# Patient Record
Sex: Male | Born: 1940 | Race: White | Hispanic: No | Marital: Married | Smoking: Former smoker
Health system: Southern US, Community
[De-identification: ages and names within clinical notes are randomized; demographics above are authoritative.]

## PROBLEM LIST (undated history)

## (undated) DIAGNOSIS — I480 Paroxysmal atrial fibrillation: Secondary | ICD-10-CM

## (undated) DIAGNOSIS — M1990 Unspecified osteoarthritis, unspecified site: Secondary | ICD-10-CM

## (undated) DIAGNOSIS — J449 Chronic obstructive pulmonary disease, unspecified: Secondary | ICD-10-CM

## (undated) DIAGNOSIS — C61 Malignant neoplasm of prostate: Secondary | ICD-10-CM

## (undated) DIAGNOSIS — I10 Essential (primary) hypertension: Secondary | ICD-10-CM

## (undated) DIAGNOSIS — G4730 Sleep apnea, unspecified: Secondary | ICD-10-CM

## (undated) HISTORY — PX: POLYPECTOMY: SHX149

## (undated) HISTORY — PX: FRACTURE SURGERY: SHX138

## (undated) HISTORY — DX: Essential (primary) hypertension: I10

## (undated) HISTORY — DX: Malignant neoplasm of prostate: C61

## (undated) HISTORY — PX: COLONOSCOPY: SHX174

## (undated) HISTORY — DX: Unspecified osteoarthritis, unspecified site: M19.90

## (undated) HISTORY — DX: Paroxysmal atrial fibrillation: I48.0

## (undated) HISTORY — DX: Chronic obstructive pulmonary disease, unspecified: J44.9

## (undated) HISTORY — DX: Sleep apnea, unspecified: G47.30

## (undated) HISTORY — PX: PROSTATECTOMY: SHX69

## (undated) HISTORY — PX: HERNIA REPAIR: SHX51

---

## 2007-04-24 ENCOUNTER — Ambulatory Visit (HOSPITAL_COMMUNITY): Admission: RE | Admit: 2007-04-24 | Discharge: 2007-04-24 | Payer: Self-pay | Admitting: Urology

## 2007-06-21 ENCOUNTER — Encounter: Payer: Self-pay | Admitting: Urology

## 2007-06-21 ENCOUNTER — Inpatient Hospital Stay (HOSPITAL_COMMUNITY): Admission: RE | Admit: 2007-06-21 | Discharge: 2007-06-22 | Payer: Self-pay | Admitting: Urology

## 2007-11-14 ENCOUNTER — Ambulatory Visit: Payer: Self-pay | Admitting: Gastroenterology

## 2007-11-14 LAB — CONVERTED CEMR LAB
Albumin: 3.7 g/dL (ref 3.5–5.2)
Alkaline Phosphatase: 121 units/L — ABNORMAL HIGH (ref 39–117)
BUN: 14 mg/dL (ref 6–23)
Basophils Absolute: 0.1 10*3/uL (ref 0.0–0.1)
Eosinophils Absolute: 0.2 10*3/uL (ref 0.0–0.6)
GFR calc Af Amer: 124 mL/min
Hemoglobin: 14.5 g/dL (ref 13.0–17.0)
Lymphocytes Relative: 38.5 % (ref 12.0–46.0)
MCHC: 35.4 g/dL (ref 30.0–36.0)
Monocytes Absolute: 0.9 10*3/uL — ABNORMAL HIGH (ref 0.2–0.7)
Monocytes Relative: 11.2 % — ABNORMAL HIGH (ref 3.0–11.0)
Neutro Abs: 3.5 10*3/uL (ref 1.4–7.7)
Potassium: 4.1 meq/L (ref 3.5–5.1)
Total Protein: 6.9 g/dL (ref 6.0–8.3)

## 2007-11-26 ENCOUNTER — Encounter: Payer: Self-pay | Admitting: Gastroenterology

## 2007-11-26 ENCOUNTER — Ambulatory Visit: Payer: Self-pay | Admitting: Gastroenterology

## 2008-12-08 ENCOUNTER — Ambulatory Visit: Payer: Self-pay | Admitting: Gastroenterology

## 2008-12-24 ENCOUNTER — Encounter: Payer: Self-pay | Admitting: Gastroenterology

## 2008-12-24 ENCOUNTER — Ambulatory Visit: Payer: Self-pay | Admitting: Gastroenterology

## 2008-12-29 ENCOUNTER — Encounter: Payer: Self-pay | Admitting: Gastroenterology

## 2011-03-29 NOTE — Assessment & Plan Note (Signed)
HEALTHCARE                         GASTROENTEROLOGY OFFICE NOTE   NAME:Robert Gonzalez, Robert Gonzalez                   MRN:          [id number]  DATE:11/14/2007                            DOB:          November 08, 1941    REFERRING PHYSICIAN:  Excell Seltzer. Annabell Howells, M.D.   NEW GI CONSULTATION:   PRIMARY CARE PHYSICIAN:  Wyvonnia Lora, MD   REASON FOR REFERRAL:  Dr. Annabell Howells asked me to evaluate Robert Gonzalez in  consultation regarding relative constipation, intermittent bright red  blood per rectum, mucous discharge from the rectum.   HISTORY OF PRESENT ILLNESS:  Robert Gonzalez is a very pleasant 70-year-  old man who had a radical prostatectomy by robot-assisted means in  August of this year for a relatively early-stage prostate cancer.  He  did not have any radiation treatments.  For the past several months he  has been bothered by intermittent constipation and he has to strain to  move his bowels.  He will have a firm, hard stool.  This seems to be  getting worse lately.  Approximately once a week he will have to strain  to move his bowels.  He also feels a feeling of incomplete evacuation.  About a week ago he noted a small amount of rectal blood and mucus in  his stool.  The rectal bleeding had been twice in one day.  Once was  with tissue paper wiping only.   REVIEW OF SYSTEMS:  Notable for no chest pain, no shortness of breath,  no fatigue.  Otherwise essentially normal and is available on his  nursing intake sheet.   PAST MEDICAL HISTORY:  1. Radical prostatectomy in August 2008.  2. Hernia surgery, left and right, 2000 and 2007.   CURRENT MEDICINES:  Trazodone.   ALLERGIES:  No known drug allergies.   SOCIAL HISTORY:  Married with one son.  Lives with his wife.  Nonsmoker,  nondrinker.   FAMILY HISTORY:  No colon cancer or colon polyps in the family.   PHYSICAL EXAM:  6 feet 0 inches, 158 pounds.  Blood pressure 114/74,  pulse 68.  CONSTITUTIONAL:   Generally well-appearing.  NEUROLOGIC:  Alert and oriented x3.  EYES:  Extraocular movements intact.  MOUTH:  Oropharynx moist, no lesions.  NECK:  Supple with no  lymphadenopathy.  CARDIOVASCULAR:  Heart regular rate and rhythm.  LUNGS:  Clear to auscultation bilaterally.  ABDOMEN:  Soft, nontender, nondistended, normal bowel sounds.  EXTREMITIES:  No lower extremity edema.  SKIN:  No rash or lesions on his extremities.   ASSESSMENT AND PLAN:  A 70 year old man with mild constipation,  intermittent bright red blood per rectum and mucous discharge.   I did not mention above, but he has had some lower abdominal cramping  that does seem to be relieved when he moves his bowels.  I suspect that  the etiology of his bleeding is not cancerous; however, we should  proceed with full colonoscopy at his soonest convenience.  I have  discussed with him fiber supplementation.  He will wean himself up to  one full scoop of Citrucel on a daily basis  and that will probably go a  long way to helping his intermittent constipation and as well his mild  intermittent rectal bleeding.  Get a basic set of labs including a CBC,  a complete metabolic profile and thyroid testing as well.  I see no  reason for any further blood tests or imaging studies other than those  mentioned above.     Rachael Fee, MD  Electronically Signed    DPJ/MedQ  DD: 11/14/2007  DT: 11/14/2007  Job #: [id number]   cc:   Excell Seltzer. Annabell Howells, M.D.  Wyvonnia Lora

## 2011-03-29 NOTE — Op Note (Signed)
NAME:  Robert Gonzalez, Robert Gonzalez NO.:  [id number]   MEDICAL RECORD NO.:  [id number]          PATIENT TYPE:  INP   LOCATION:  1442                         FACILITY:  [facility]   PHYSICIAN:  Excell Seltzer. Annabell Howells, M.D.    DATE OF BIRTH:  [birth year]   DATE OF PROCEDURE:  06/21/2007  DATE OF DISCHARGE:                               OPERATIVE REPORT   PROCEDURE:  Robot assisted laparoscopic radical prostatectomy with  bilateral pelvic lymphadenectomy.   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   POSTOPERATIVE DIAGNOSIS:  T1c Gleason's 8 adenocarcinoma of the  prostate.   SURGEON:  Dr. Bjorn Pippin.   ANESTHESIA:  General.   ASSISTANT:  Crecencio Mc, M.D.   ANESTHESIA:  General.   SPECIMEN:  Prostate seminal vesicles and bilateral pelvic lymph nodes.   DRAINS:  A 20-French Foley catheter and a Blake drain.   BLOOD LOSS:  200 mL   COMPLICATIONS:  None.   INDICATIONS:  [name] is a 70 year old white male who was  originally seen in consultation for a rising PSA of 5.  He did have a  soft nodule in the mid portion of the right prostate but a biopsy  demonstrated Gleason's 8 adenocarcinoma of the prostate involving 20% of  the left apical biopsies.  After discussing treatment options, he  elected radical prostatectomy.  His preoperative bone scan was  unremarkable.   FINDINGS/PROCEDURE:  The patient was given Ancef and taken to the  operating room where general anesthetic was induced.  He was placed in  the lithotomy position.  His abdomen was clipped.  He was prepped with  Hibiclens because of a prior reaction following a surgical prep.  He was  then placed in the steep Trendelenburg position and was draped in the  usual sterile fashion for the robotic procedure.  A 22-French Foley  catheter was inserted.  The balloon was filled with 30 mL of sterile  fluid.  He then underwent placement of the camera port 18 cm superior to  the pubis.  An  incision was made, the fat was spread with a hemostat.  The anterior rectus fascia was incised with the Bovie.  The rectus  muscle was spread and the posterior sheath was incised with a knife.  Hemostat was then passed through the incision and spread, opening the  posterior sheath.  Finger was then placed in the abdominal cavity and  swept around to confirm position.  A 12 mm port was then placed through  the incision.  This was secured with a figure-of-eight 0 Vicryl to  tighten the skin and the abdomen was then insufflated.  The remaining  ports were placed in the routine configuration with two 8 mm a robot  ports on the left. On the right there was an 8 mm robot port, a lateral  12 mm working port and a superior medial 5 mm working port.  The port  sites were injected with local anesthetic prior to placement.  Once all  of ports were in position, the robot was brought onto the field and  docked  The bladder was then filled with irrigant and the dissection was  initiated.  The bladder was taken down from the anterior abdominal wall  to expose the prostate.  On the right, the patient had a preperitoneal  hernia repair with mesh.  This made for a little bit slower dictation  but we easily exposed the proper space.  Once the prostate had been  exposed, the endopelvic fascia was incised on each side, freeing up the  lateral aspect of the prostate.  The dissection was carried down to the  dorsal vein complex which was dissected out by division of the  puboprostatic ligaments and exposure down to the groove between the  dorsal vein and the urethra on each side.  At this point, the dorsal  vein complex was controlled with an Endo-GIA stapler.   We then turned our attention to the bladder neck dissection.  The  bladder neck was incised anteriorly using the cautery scissors until the  Foley catheter was exposed.  The Foley catheter was then grasped with  the fourth arm and anterior traction  was applied.  The posterior of  bladder neck was then incised with great care to avoid excessive  enlargement of the bladder neck opening and the structures were exposed.  The left vas was dissected out, divided and used to provide anterior  traction with the fourth arm.  The left seminal vesicle was then  dissected out using cautery and blunt dissection.  The right vas and  seminal vesicle were then dissected out in a similar fashion without  difficulty.  At this point, Denonvilliers' fascia was opened and the  prostate was dissected off the rectum, out to the apex and out laterally  as well.   At this point, the anterior traction was released, the prostate was  retracted to the left with the fourth arm and a left nerve spare was  performed.  The prostatic fascia was incised and the plane was developed  between the prostate and the neurovascular bundle which was dissected  off without difficulty.  This was then repeated on the left side.  Once  both nerve-sparing dissections were performed, we turned our attention  to the pedicles.  The right pedicle was taken down using large clips and  the remaining attachments of prostate were dissected out to the apex.  This was then repeated on the left side.  A small area of bleeding on  the right pedicle required bipolar cautery but in general, hemostasis  was good.   At this point, the Foley catheter was reinserted.  The residual dorsal  vein complex was divided using the cautery scissors and the urethra was  divided using sharp dissection without cautery.  The specimen was then  removed from the field and the pelvic floor was inspected.  No active  bleeding was identified.  The pelvis was irrigated and rectum was  insufflated without evidence of rectal injury.  The pelvis was then  aspirated.   We then turned our attention to the node dissection.  The left node  dissection was performed initially.  The iliac vein was identified, the  node  packet was developed out to the body wall and the obturator nerve  was then identified and the node packet was freed from the obturator  nerve.  The proximal end of the packet was clipped and divided.  I then  performed additional dissection to expose the packet down to the  bifurcation of the iliac vessels where another  clip was placed.  The  packet was then removed.  This was then repeated on the right side  without difficulty.  Once both node packets had been removed, we turned  our attention to the anastomosis.   A 3-0 Vicryl suture was used to reapproximate the rectourethralis and  the incised Denonvilliers' fascia to remove traction from the  anastomosis.  A second 3-0 Vicryl suture was used to provide a stay  suture between the posterior urethra and the bladder neck.  Once this  suture had been tied, a two-tone Monocryl stitch was then used to  perform a running anastomosis between the urethra and bladder neck.  Once the anastomosis had been completed, the sutures were tightened and  tied.  A fresh 20-French coude Foley catheter was inserted.  The balloon  was filled with 15 mL of sterile fluid and the catheter was irrigated  easily without evidence of anastomotic leak.   At this point, we did notice a little bleeding from the right side.  Inspection revealed that it was coming from some vessels near the  sidewall at the apex that required some bipolar cautery for control  along with a small piece of Surgicel.   At this point, inspection revealed no active bleeding.  The #10 round  Blake drain was placed through the fourth arm port and positioned in the  pelvis.  The port was then removed and the drain was secured with a 3-0  nylon suture.  The robot was undocked and moved away from the field.  The specimen was placed in an entrapment sac through the camera port.  The camera port was then reinserted and the right 12 mm working port was  removed and the port site was closed with  2-0 Vicryl and a suture  passer.  Once this had been tied, the remaining ports were removed.   We then opened the camera port incision and extended it to remove the  specimen.  During this portion of the procedure, we noted some bright  red bleeding from the camera port.  Initially, there was concern this  was intra-abdominal bleeding so the port was replaced and the abdomen  was reinspected, no active bleeding was noted.  We then discovered a  small arterial perforator in the muscle that required cauterization.  Once this had been performed, no further bleeding was noted.  The  specimen was then removed and the anterior rectus fascia of the camera  port was closed with a running 0 Vicryl suture.  The port sites were  then reinfiltrated with local anesthetic and closed with skin staples.  The catheter was irrigated once again with return of blue stained urine  from the indigo carmine given during the procedure.  The catheter was  placed to straight drainage, the drapes were removed.  Dressings were  applied to the abdominal incision.  The catheter was secured to the  patient's leg.  He was taken down from lithotomy position.  His  anesthetic was reversed.  He was moved to the recovery room in stable  condition.  There were no complications.   Early in the procedure during the set-up, a red rubber rectal catheter  was placed prior to the prep and drape.      Excell Seltzer. Annabell Howells, M.D.  Electronically Signed     JJW/MEDQ  D:  06/22/2007  T:  06/22/2007  Job:  [id number]   cc:   Wyvonnia Lora  Fax: [phone]

## 2011-08-29 LAB — BASIC METABOLIC PANEL
CO2: 29
CO2: 30
Calcium: 8.1 — ABNORMAL LOW
Chloride: 106
Chloride: 102
GFR calc Af Amer: >60
GFR calc Af Amer: >60
GFR calc non Af Amer: >60
Potassium: 4.5
Sodium: 139
Sodium: 141

## 2011-08-29 LAB — CBC
Hemoglobin: 12 — ABNORMAL LOW
MCHC: 34.2
Platelets: 276
RBC: 3.75 — ABNORMAL LOW
RDW: 12
WBC: 8.2

## 2011-08-29 LAB — DIFFERENTIAL
Basophils Absolute: 0.1
Basophils Relative: 1
Eosinophils Relative: 3
Lymphocytes Relative: 33
Lymphocytes Relative: 38
Lymphs Abs: 3.9 — ABNORMAL HIGH
Monocytes Absolute: 1.2 — ABNORMAL HIGH
Monocytes Relative: 10
Neutro Abs: 6.1
Neutro Abs: 4.1
Neutrophils Relative %: 50

## 2011-11-30 ENCOUNTER — Encounter: Payer: Self-pay | Admitting: Gastroenterology

## 2011-12-02 ENCOUNTER — Encounter: Payer: Self-pay | Admitting: Gastroenterology

## 2012-01-09 ENCOUNTER — Ambulatory Visit (AMBULATORY_SURGERY_CENTER): Payer: BC Managed Care – PPO | Admitting: *Deleted

## 2012-01-09 DIAGNOSIS — Z1211 Encounter for screening for malignant neoplasm of colon: Secondary | ICD-10-CM

## 2012-01-09 DIAGNOSIS — Z86010 Personal history of colonic polyps: Secondary | ICD-10-CM

## 2012-01-09 MED ORDER — PEG-KCL-NACL-NASULF-NA ASC-C 100 G PO SOLR: ORAL | Status: DC

## 2012-01-23 ENCOUNTER — Ambulatory Visit (AMBULATORY_SURGERY_CENTER): Payer: BC Managed Care – PPO | Admitting: Gastroenterology

## 2012-01-23 ENCOUNTER — Encounter: Payer: Self-pay | Admitting: Gastroenterology

## 2012-01-23 ENCOUNTER — Other Ambulatory Visit: Payer: Self-pay | Admitting: Gastroenterology

## 2012-01-23 VITALS — BP 144/94 | HR 79 | Temp 96.4°F | Resp 15 | Ht 72.0 in | Wt 163.0 lb

## 2012-01-23 DIAGNOSIS — D126 Benign neoplasm of colon, unspecified: Secondary | ICD-10-CM

## 2012-01-23 DIAGNOSIS — Z1211 Encounter for screening for malignant neoplasm of colon: Secondary | ICD-10-CM

## 2012-01-23 DIAGNOSIS — Z86010 Personal history of colonic polyps: Secondary | ICD-10-CM

## 2012-01-23 MED ORDER — SODIUM CHLORIDE 0.9 % IV SOLN: 500.0000 mL | INTRAVENOUS | Status: DC

## 2012-01-23 NOTE — Patient Instructions (Signed)
Discharge instructions given with verbal understanding. Handout on polyps given. Resume previous medications. YOU HAD AN ENDOSCOPIC PROCEDURE TODAY AT THE [facility]: Refer to the procedure report that was given to you for any specific questions about what was found during the examination.  If the procedure report does not answer your questions, please call your gastroenterologist to clarify.  If you requested that your care partner not be given the details of your procedure findings, then the procedure report has been included in a sealed envelope for you to review at your convenience later.  YOU SHOULD EXPECT: Some feelings of bloating in the abdomen. Passage of more gas than usual.  Walking can help get rid of the air that was put into your GI tract during the procedure and reduce the bloating. If you had a lower endoscopy (such as a colonoscopy or flexible sigmoidoscopy) you may notice spotting of blood in your stool or on the toilet paper. If you underwent a bowel prep for your procedure, then you may not have a normal bowel movement for a few days.  DIET: Your first meal following the procedure should be a light meal and then it is ok to progress to your normal diet.  A half-sandwich or bowl of soup is an example of a good first meal.  Heavy or fried foods are harder to digest and may make you feel nauseous or bloated.  Likewise meals heavy in dairy and vegetables can cause extra gas to form and this can also increase the bloating.  Drink plenty of fluids but you should avoid alcoholic beverages for 24 hours.  ACTIVITY: Your care partner should take you home directly after the procedure.  You should plan to take it easy, moving slowly for the rest of the day.  You can resume normal activity the day after the procedure however you should NOT DRIVE or use heavy machinery for 24 hours (because of the sedation medicines used during the test).    SYMPTOMS TO REPORT IMMEDIATELY: A  gastroenterologist can be reached at any hour.  During normal business hours, 8:30 AM to 5:00 PM Monday through Friday, call [phone].  After hours and on weekends, please call the GI answering service at [phone] who will take a message and have the physician on call contact you.   Following lower endoscopy (colonoscopy or flexible sigmoidoscopy):  Excessive amounts of blood in the stool  Significant tenderness or worsening of abdominal pains  Swelling of the abdomen that is new, acute  Fever of 100F or higher  FOLLOW UP: If any biopsies were taken you will be contacted by phone or by letter within the next 1-3 weeks.  Call your gastroenterologist if you have not heard about the biopsies in 3 weeks.  Our staff will call the home number listed on your records the next business day following your procedure to check on you and address any questions or concerns that you may have at that time regarding the information given to you following your procedure. This is a courtesy call and so if there is no answer at the home number and we have not heard from you through the emergency physician on call, we will assume that you have returned to your regular daily activities without incident.  SIGNATURES/CONFIDENTIALITY: You and/or your care partner have signed paperwork which will be entered into your electronic medical record.  These signatures attest to the fact that that the information above on your After Visit Summary has   been reviewed and is understood.  Full responsibility of the confidentiality of this discharge information lies with you and/or your care-partner. 

## 2012-01-23 NOTE — Op Note (Signed)
[facility] [address] [place], [place]  [address]  COLONOSCOPY PROCEDURE REPORT  PATIENT:  Robert Gonzalez, Robert Gonzalez  MR#:  [id number] BIRTHDATE:  08/23/1941, 71 yrs. old  GENDER:  male ENDOSCOPIST:  Rachael Fee, MD PROCEDURE DATE:  01/23/2012 PROCEDURE:  Colonoscopy with snare polypectomy ASA CLASS:  Class II INDICATIONS:  adenomatous polyps 2009 and 2010 colonoscopies MEDICATIONS:  Fentanyl 100 mcg IV, These medications were titrated to patient response per physician's verbal order, Versed 8 mg IV  DESCRIPTION OF PROCEDURE:   After the risks benefits and alternatives of the procedure were thoroughly explained, informed consent was obtained.  Digital rectal exam was performed and revealed no rectal masses.   The  endoscope was introduced through the anus and advanced to the cecum, which was identified by both the appendix and ileocecal valve, without limitations.  The quality of the prep was good..  The instrument was then slowly withdrawn as the colon was fully examined. <<PROCEDUREIMAGES>> FINDINGS:   Five polyps were found, removed and sent to pathology. One was pedunculated, located in sigmoid, measured 1cm across, removed with snare/cautery and sent to pathology (jar 2). The rest were sessile, ranging in size from 3 to 8mm, located in ascending and transverse segments, removed with cold snare and all sent to pathology (jar 1) (see image1, image4, and image5).  This was otherwise a normal examination of the colon (see image3, image2, and image8).   Retroflexed views in the rectum revealed no abnormalities. COMPLICATIONS:  None  ENDOSCOPIC IMPRESSION: 1) Five polyps, all were removed and all were sent to pathology  2) Otherwise normal examination  RECOMMENDATIONS: 1) If the polyp(s) removed today are proven to be adenomatous (pre-cancerous) polyps, you will need a colonoscopy in 3 years. You will receive a letter within 1-2 weeks with the results of  your biopsy as well as final recommendations. Please call my office if you have not received a letter after 3 weeks.  ______________________________ Rachael Fee, MD  cc: Wyvonnia Lora, MD  n. eSIGNED:   Rachael Fee at 01/23/2012 10:53 AM  Karlyn Agee, [phone]

## 2012-01-23 NOTE — Progress Notes (Signed)
Patient did not experience any of the following events: a burn prior to discharge; a fall within the facility; wrong site/side/patient/procedure/implant event; or a hospital transfer or hospital admission upon discharge from the facility. (G8907) Patient did not have preoperative order for IV antibiotic SSI prophylaxis. (G8918)  

## 2012-01-24 ENCOUNTER — Telehealth: Payer: Self-pay | Admitting: *Deleted

## 2012-01-24 NOTE — Telephone Encounter (Signed)
  Follow up Call-  Call back number 01/23/2012  Post procedure Call Back phone  # [phone]  Permission to leave phone message Yes     Patient questions:  Do you have a fever, pain , or abdominal swelling? no Pain Score  0 *  Have you tolerated food without any problems? yes  Have you been able to return to your normal activities? yes  Do you have any questions about your discharge instructions: Diet   no Medications  no Follow up visit  no  Do you have questions or concerns about your Care? no  Actions: * If pain score is 4 or above: No action needed, pain <4.

## 2012-01-27 ENCOUNTER — Encounter: Payer: Self-pay | Admitting: Gastroenterology

## 2012-10-10 ENCOUNTER — Encounter: Payer: Self-pay | Admitting: Gastroenterology

## 2013-07-05 ENCOUNTER — Ambulatory Visit (HOSPITAL_COMMUNITY)
Admission: RE | Admit: 2013-07-05 | Discharge: 2013-07-05 | Disposition: A | Discharge status: Home / Self Care | Payer: BC Managed Care – PPO | Source: Ambulatory Visit | Attending: Urology | Admitting: Urology

## 2013-07-05 ENCOUNTER — Other Ambulatory Visit: Payer: Self-pay | Admitting: Urology

## 2013-07-05 ENCOUNTER — Ambulatory Visit (INDEPENDENT_AMBULATORY_CARE_PROVIDER_SITE_OTHER): Payer: BC Managed Care – PPO | Admitting: Urology

## 2013-07-05 DIAGNOSIS — R10819 Abdominal tenderness, unspecified site: Secondary | ICD-10-CM | POA: Insufficient documentation

## 2013-07-05 DIAGNOSIS — M545 Low back pain, unspecified: Secondary | ICD-10-CM

## 2013-07-05 DIAGNOSIS — R339 Retention of urine, unspecified: Secondary | ICD-10-CM

## 2013-07-05 DIAGNOSIS — R3915 Urgency of urination: Secondary | ICD-10-CM

## 2013-07-23 ENCOUNTER — Other Ambulatory Visit: Payer: Self-pay | Admitting: Urology

## 2013-07-23 DIAGNOSIS — R3915 Urgency of urination: Secondary | ICD-10-CM

## 2013-07-25 ENCOUNTER — Ambulatory Visit (HOSPITAL_COMMUNITY)
Admission: RE | Admit: 2013-07-25 | Discharge: 2013-07-25 | Disposition: A | Discharge status: Home / Self Care | Payer: BC Managed Care – PPO | Source: Ambulatory Visit | Attending: Urology | Admitting: Urology

## 2013-07-25 ENCOUNTER — Encounter (HOSPITAL_COMMUNITY): Payer: Self-pay

## 2013-07-25 DIAGNOSIS — R3915 Urgency of urination: Secondary | ICD-10-CM

## 2013-07-25 DIAGNOSIS — K5730 Diverticulosis of large intestine without perforation or abscess without bleeding: Secondary | ICD-10-CM | POA: Insufficient documentation

## 2013-07-25 DIAGNOSIS — R1032 Left lower quadrant pain: Secondary | ICD-10-CM | POA: Insufficient documentation

## 2013-07-25 LAB — POCT I-STAT, CHEM 8
BUN: 17 mg/dL (ref 6–23)
Calcium, Ion: 1.26 mmol/L (ref 1.13–1.30)
Chloride: 105 mEq/L (ref 96–112)

## 2013-07-25 MED ORDER — IOHEXOL 300 MG/ML  SOLN
125.0000 mL | Freq: Once | INTRAMUSCULAR | Status: AC | PRN
  Administered 2013-07-25: 125 mL via INTRAVENOUS

## 2013-07-26 ENCOUNTER — Ambulatory Visit (INDEPENDENT_AMBULATORY_CARE_PROVIDER_SITE_OTHER): Payer: BC Managed Care – PPO | Admitting: Urology

## 2013-07-26 DIAGNOSIS — R3915 Urgency of urination: Secondary | ICD-10-CM

## 2013-11-03 IMAGING — CT CT ABD-PEL WO/W CM
3 of 10 series · 12 of 46 positions shown, 18 images · IV contrast (Omnipaque 300)
Comparison: None.

CLINICAL DATA: Left-sided flank pain. Urgency and dysuria. Prostate
carcinoma.

EXAM:
CT ABDOMEN AND PELVIS WITHOUT AND WITH CONTRAST
TECHNIQUE: Multidetector CT imaging of the abdomen and pelvis was performed
without contrast material in one or both body regions, followed by
contrast material(s) and further sections in one or both body
regions.
CONTRAST:  125 cc Omnipaque 300

[Series 3: hematuria pre 5.0 b40f · axial · non-contrast · 0.74mm/px · z∈[-434,-84]mm · 6 of 98 slices shown, 11 images]
[im 14/98  soft-tissue]
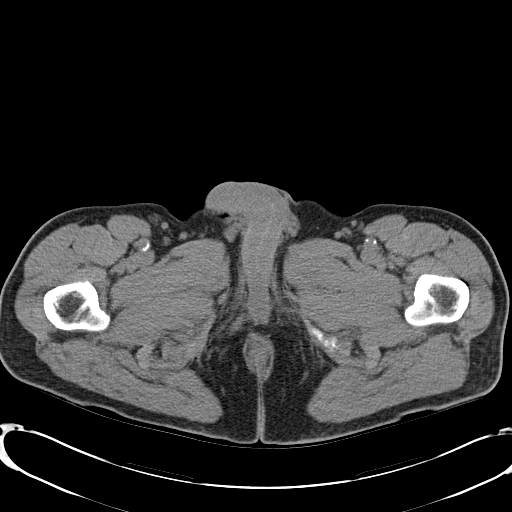
[im 14/98  bone]
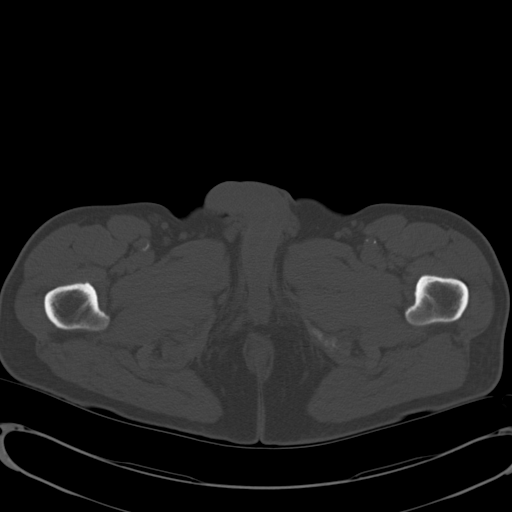
[im 28/98  soft-tissue]
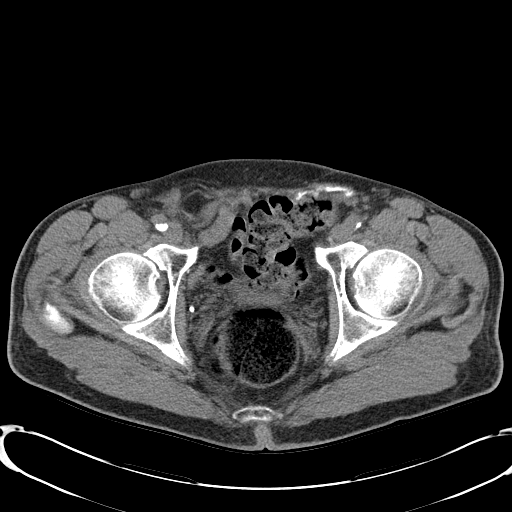
[im 42/98  soft-tissue]
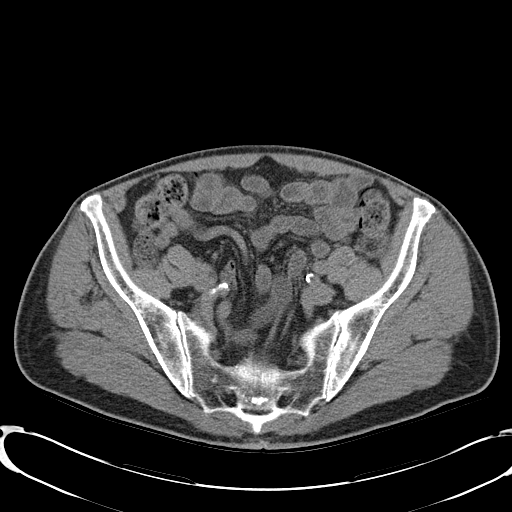
[im 42/98  lung]
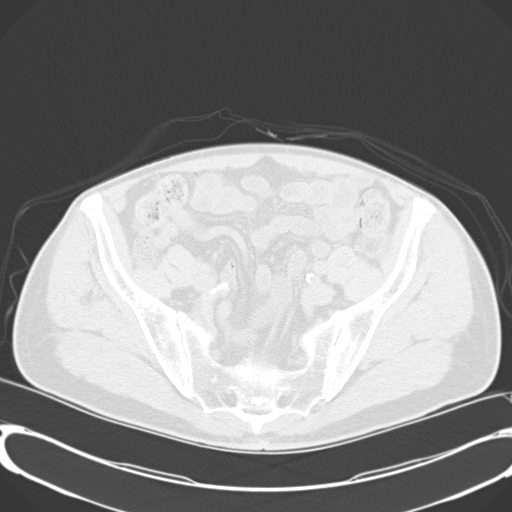
[im 56/98  soft-tissue]
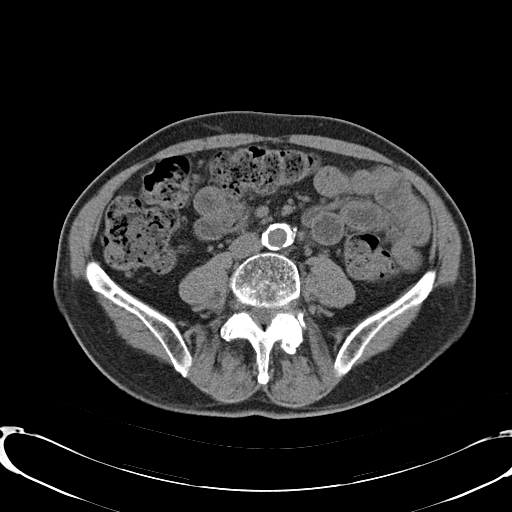
[im 56/98  lung]
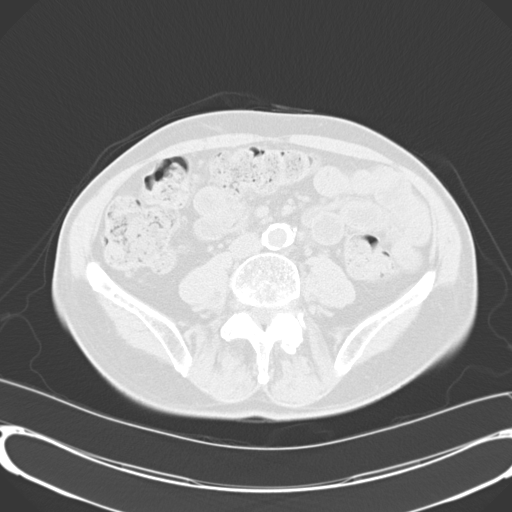
[im 70/98  soft-tissue]
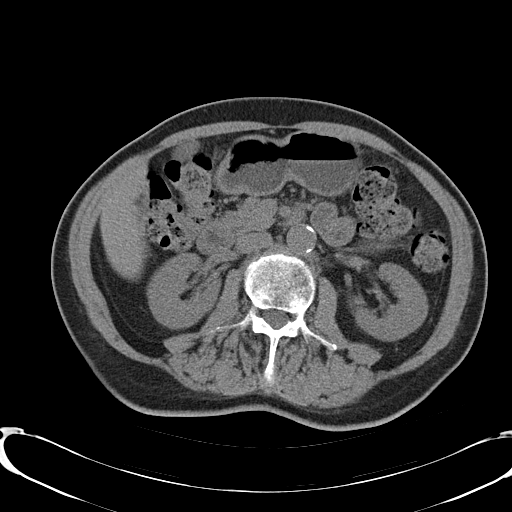
[im 70/98  lung]
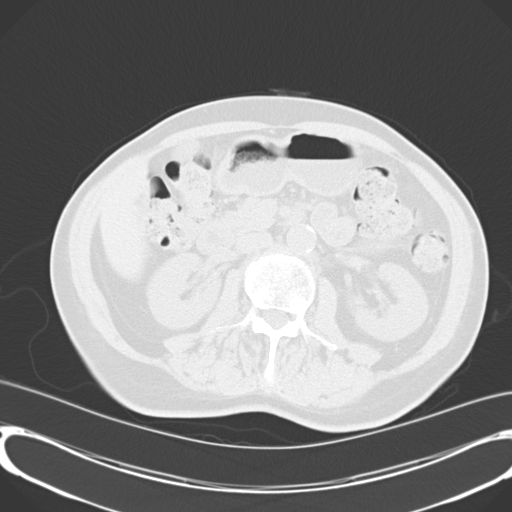
[im 84/98  soft-tissue]
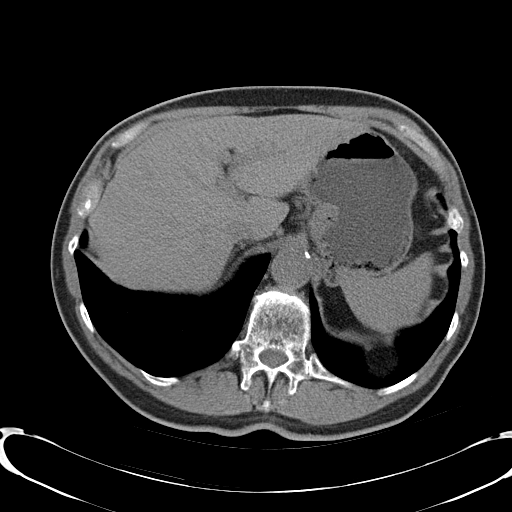
[im 84/98  lung]
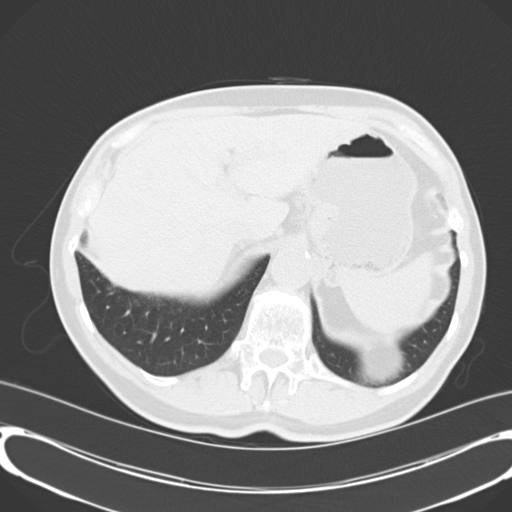

[Series 7: hematuria post axial 5.0 b40f · axial · 0.74mm/px · z∈[-430,-220]mm · 4 of 98 slices shown]
[im 14/98  soft-tissue]
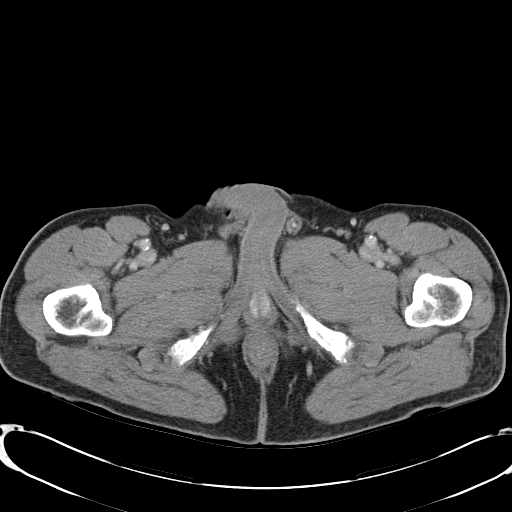
[im 28/98  soft-tissue]
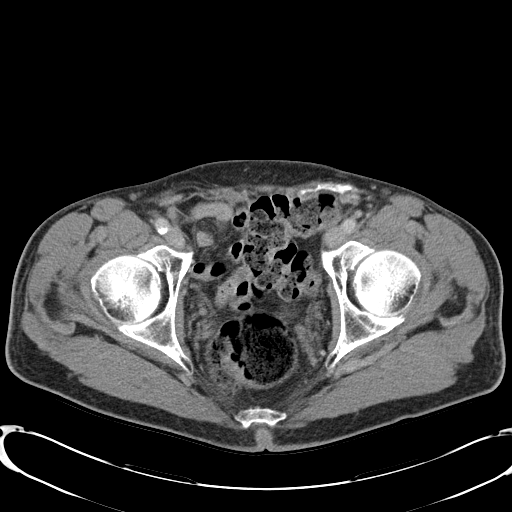
[im 42/98  soft-tissue]
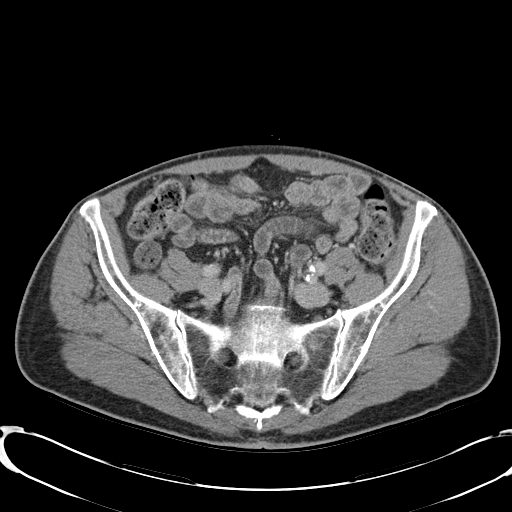
[im 56/98  soft-tissue]
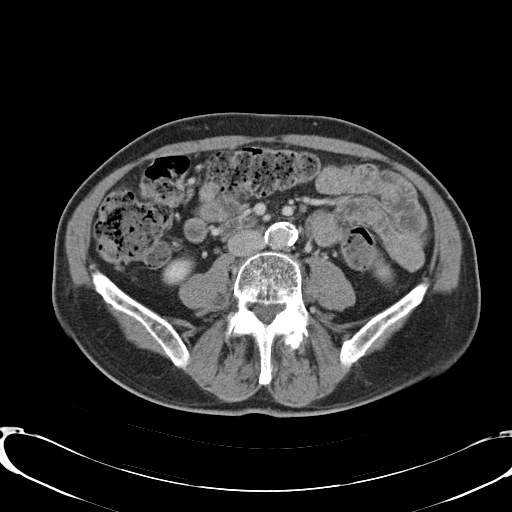

[Series 8: hematuria mpr coro post 3.0 · coronal · 0.73mm/px · 2 of 98 slices shown, 3 images]
[im 33/98  soft-tissue]
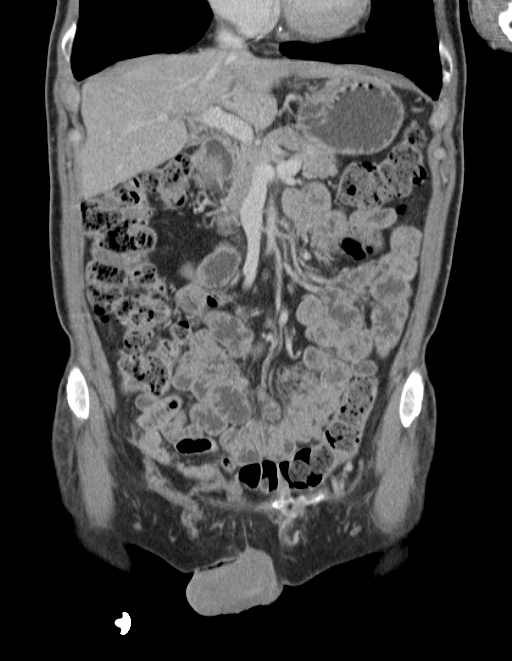
[im 33/98  bone]
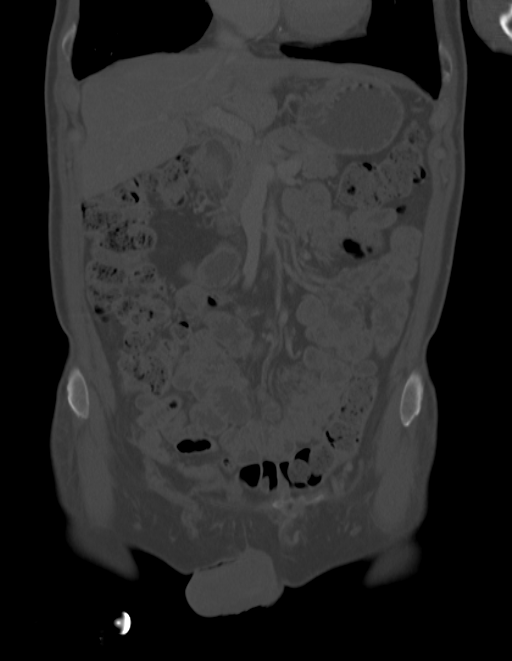
[im 65/98  soft-tissue]
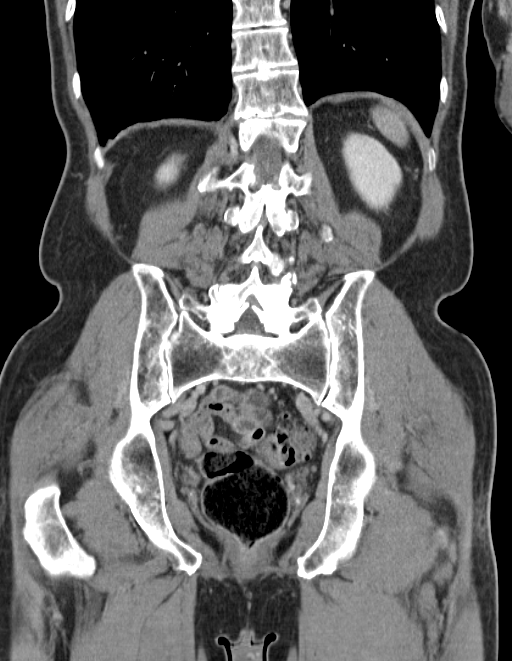

[12 of 46 positions shown; findings below may reference images not displayed]

FINDINGS: No evidence of renal calculi or hydronephrosis. No evidence of
ureteral calculi or dilatation. No bladder calculi identified.

Postcontrast phases show no evidence of renal masses or other
parenchymal lesions. Excretory phase shows no evidence of masses or
filling defects involving the renal collecting systems, opacified
portions of ureters, or urinary bladder. Prostate appears surgically
absent. No pelvic masses or lymphadenopathy identified. Postsurgical
changes are seen from previous left inguinal hernia repair. No
evidence of recurrent hernia.

No evidence of abdominal or retroperitoneal lymphadenopathy. The
liver, gallbladder, spleen, pancreas, and adrenal glands are normal
in appearance.

Sigmoid diverticulosis noted, however there is no evidence of
diverticulitis. No other inflammatory process or abnormal fluid
collections are seen. Normal appendix visualized. No evidence of
dilated bowel loops. No suspicious bone lesions identified.
IMPRESSION: No evidence of urinary tract neoplasm, urolithiasis, or
hydronephrosis.

Diverticulosis. No radiographic evidence of diverticulitis.

## 2013-11-22 ENCOUNTER — Ambulatory Visit (INDEPENDENT_AMBULATORY_CARE_PROVIDER_SITE_OTHER): Payer: BC Managed Care – PPO | Admitting: Urology

## 2013-11-22 DIAGNOSIS — R3915 Urgency of urination: Secondary | ICD-10-CM

## 2013-11-22 DIAGNOSIS — N529 Male erectile dysfunction, unspecified: Secondary | ICD-10-CM

## 2013-11-22 DIAGNOSIS — N393 Stress incontinence (female) (male): Secondary | ICD-10-CM

## 2013-11-22 DIAGNOSIS — Z8546 Personal history of malignant neoplasm of prostate: Secondary | ICD-10-CM

## 2014-12-12 ENCOUNTER — Ambulatory Visit (INDEPENDENT_AMBULATORY_CARE_PROVIDER_SITE_OTHER): Payer: BLUE CROSS/BLUE SHIELD | Admitting: Urology

## 2014-12-12 DIAGNOSIS — Z8546 Personal history of malignant neoplasm of prostate: Secondary | ICD-10-CM | POA: Diagnosis not present

## 2014-12-12 DIAGNOSIS — N5231 Erectile dysfunction following radical prostatectomy: Secondary | ICD-10-CM

## 2014-12-12 DIAGNOSIS — N393 Stress incontinence (female) (male): Secondary | ICD-10-CM

## 2015-02-24 ENCOUNTER — Encounter: Payer: Self-pay | Admitting: Gastroenterology

## 2015-03-18 ENCOUNTER — Encounter: Payer: Self-pay | Admitting: Gastroenterology

## 2015-05-08 ENCOUNTER — Ambulatory Visit (AMBULATORY_SURGERY_CENTER): Payer: BLUE CROSS/BLUE SHIELD

## 2015-05-08 VITALS — Ht 72.0 in | Wt 163.8 lb

## 2015-05-08 DIAGNOSIS — Z860100 Personal history of colon polyps, unspecified: Secondary | ICD-10-CM

## 2015-05-08 NOTE — Progress Notes (Signed)
No allergies to eggs or soy No diet/weight loss meds No home oxygen No past problems with anesthesia EXCEPT "BREAKS OUT" WITH GENERAL ANESTHESIA  No computer

## 2015-05-22 ENCOUNTER — Ambulatory Visit (AMBULATORY_SURGERY_CENTER): Payer: BLUE CROSS/BLUE SHIELD | Admitting: Gastroenterology

## 2015-05-22 ENCOUNTER — Encounter: Payer: Self-pay | Admitting: Gastroenterology

## 2015-05-22 VITALS — BP 103/68 | HR 53 | Temp 96.9°F | Resp 16 | Ht 72.0 in | Wt 163.0 lb

## 2015-05-22 DIAGNOSIS — D123 Benign neoplasm of transverse colon: Secondary | ICD-10-CM

## 2015-05-22 DIAGNOSIS — D125 Benign neoplasm of sigmoid colon: Secondary | ICD-10-CM

## 2015-05-22 DIAGNOSIS — Z86010 Personal history of colonic polyps: Secondary | ICD-10-CM

## 2015-05-22 DIAGNOSIS — K635 Polyp of colon: Secondary | ICD-10-CM

## 2015-05-22 MED ORDER — SODIUM CHLORIDE 0.9 % IV SOLN: 500.0000 mL | INTRAVENOUS | Status: DC

## 2015-05-22 NOTE — Op Note (Signed)
[place]  [address] [place], [id number]   COLONOSCOPY PROCEDURE REPORT  PATIENT: Robert, Gonzalez  MR#: [id number] BIRTHDATE: [birth year] , 74  yrs. old GENDER: male ENDOSCOPIST: Milus Banister, MD PROCEDURE DATE:  05/22/2015 PROCEDURE:   Colonoscopy, surveillance and Colonoscopy with snare polypectomy First Screening Colonoscopy - Avg.  risk and is 50 yrs.  old or older - No.  Prior Negative Screening - Now for repeat screening. N/A  History of Adenoma - Now for follow-up colonoscopy & has been > or = to 3 yrs.  Yes hx of adenoma.  Has been 3 or more years since last colonoscopy.  Polyps removed today? Yes ASA CLASS:   Class II INDICATIONS:Surveillance due to prior colonic neoplasia and colonoscopy 2013 Dr.  Ardis Hughs, 5 polyps removed (all precancerous).  MEDICATIONS: Monitored anesthesia care and Propofol 200 mg IV  DESCRIPTION OF PROCEDURE:   After the risks benefits and alternatives of the procedure were thoroughly explained, informed consent was obtained.  The digital rectal exam revealed no abnormalities of the rectum.   The LB [id number]  endoscope was introduced through the anus and advanced to the cecum, which was identified by both the appendix and ileocecal valve. No adverse events experienced.   The quality of the prep was excellent.  The instrument was then slowly withdrawn as the colon was fully examined. Estimated blood loss is zero unless otherwise noted in this procedure report.  COLON FINDINGS: Two sessile polyps ranging between 3-40mm in size were found in the sigmoid colon and transverse colon. Polypectomies were performed with a cold snare.  The resection was complete, the polyp tissue was completely retrieved and sent to histology.   There was mild diverticulosis noted throughout the entire examined colon.   The examination was otherwise normal. Retroflexed views revealed no abnormalities. The time to cecum = 6.1 Withdrawal  time = 11.4   The scope was withdrawn and the procedure completed. COMPLICATIONS: There were no immediate complications.  ENDOSCOPIC IMPRESSION: 1.   Two sessile polyps ranging between 3-62mm in size were found in the sigmoid colon and transverse colon; polypectomies were performed with a cold snare 2.   Mild diverticulosis was noted throughout the entire examined colon 3.   The examination was otherwise normal  RECOMMENDATIONS: If the polyp(s) removed today are proven to be adenomatous (pre-cancerous) polyps, you will need a repeat colonoscopy in 5 years. You will receive a letter within 1-2 weeks with the results of your biopsy as well as final recommendations.  Please call my office if you have not received a letter after 3 weeks.  eSigned:  Milus Banister, MD 05/22/2015 10:45 AM

## 2015-05-22 NOTE — Patient Instructions (Signed)
See Procedure report for findings and recommendations  YOU HAD AN ENDOSCOPIC PROCEDURE TODAY AT THE [facility]:   Refer to the procedure report that was given to you for any specific questions about what was found during the examination.  If the procedure report does not answer your questions, please call your gastroenterologist to clarify.  If you requested that your care partner not be given the details of your procedure findings, then the procedure report has been included in a sealed envelope for you to review at your convenience later.  YOU SHOULD EXPECT: Some feelings of bloating in the abdomen. Passage of more gas than usual.  Walking can help get rid of the air that was put into your GI tract during the procedure and reduce the bloating. If you had a lower endoscopy (such as a colonoscopy or flexible sigmoidoscopy) you may notice spotting of blood in your stool or on the toilet paper. If you underwent a bowel prep for your procedure, you may not have a normal bowel movement for a few days.  Please Note:  You might notice some irritation and congestion in your nose or some drainage.  This is from the oxygen used during your procedure.  There is no need for concern and it should clear up in a day or so.  SYMPTOMS TO REPORT IMMEDIATELY:   Following lower endoscopy (colonoscopy or flexible sigmoidoscopy):  Excessive amounts of blood in the stool  Significant tenderness or worsening of abdominal pains  Swelling of the abdomen that is new, acute  Fever of 100F or higher   Following upper endoscopy (EGD)  Vomiting of blood or coffee ground material  New chest pain or pain under the shoulder blades  Painful or persistently difficult swallowing  New shortness of breath  Fever of 100F or higher  Black, tarry-looking stools  For urgent or emergent issues, a gastroenterologist can be reached at any hour by calling [phone].   DIET: Your first meal following the  procedure should be a small meal and then it is ok to progress to your normal diet. Heavy or fried foods are harder to digest and may make you feel nauseous or bloated.  Likewise, meals heavy in dairy and vegetables can increase bloating.  Drink plenty of fluids but you should avoid alcoholic beverages for 24 hours.  ACTIVITY:  You should plan to take it easy for the rest of today and you should NOT DRIVE or use heavy machinery until tomorrow (because of the sedation medicines used during the test).    FOLLOW UP: Our staff will call the number listed on your records the next business day following your procedure to check on you and address any questions or concerns that you may have regarding the information given to you following your procedure. If we do not reach you, we will leave a message.  However, if you are feeling well and you are not experiencing any problems, there is no need to return our call.  We will assume that you have returned to your regular daily activities without incident.  If any biopsies were taken you will be contacted by phone or by letter within the next 1-3 weeks.  Please call us at [phone] if you have not heard about the biopsies in 3 weeks.    SIGNATURES/CONFIDENTIALITY: You and/or your care partner have signed paperwork which will be entered into your electronic medical record.  These signatures attest to the fact that that the information above   on your After Visit Summary has been reviewed and is understood.  Full responsibility of the confidentiality of this discharge information lies with you and/or your care-partner.  Please follow all discharge instructions given to you by the recovery room nurse. If you have any questions or problems after discharge please call one of the numbers listed above. You will receive a phone call in the am to see how you are doing and answer any questions you may have. Thank you for choosing [facility] for your health  care needs. 

## 2015-05-22 NOTE — Progress Notes (Signed)
A/ox3 pleased with MAC, report to Carlye Grippe

## 2015-05-22 NOTE — Progress Notes (Signed)
Called to room to assist during endoscopic procedure.  Patient ID and intended procedure confirmed with present staff. Received instructions for my participation in the procedure from the performing physician.  

## 2015-05-25 ENCOUNTER — Telehealth: Payer: Self-pay | Admitting: *Deleted

## 2015-05-25 NOTE — Telephone Encounter (Signed)
  Follow up Call-  Call back number 05/22/2015  Post procedure Call Back phone  # [phone]  Permission to leave phone message Yes     Patient questions:  Do you have a fever, pain , or abdominal swelling? No. Pain Score  0 *  Have you tolerated food without any problems? Yes.    Have you been able to return to your normal activities? Yes.    Do you have any questions about your discharge instructions: Diet   No. Medications  No. Follow up visit  No.  Do you have questions or concerns about your Care? No.  Actions: * If pain score is 4 or above: No action needed, pain <4.

## 2015-05-28 ENCOUNTER — Encounter: Payer: Self-pay | Admitting: Gastroenterology

## 2015-09-18 ENCOUNTER — Encounter: Payer: Self-pay | Admitting: Gastroenterology

## 2016-03-23 DIAGNOSIS — I480 Paroxysmal atrial fibrillation: Secondary | ICD-10-CM | POA: Insufficient documentation

## 2016-03-23 DIAGNOSIS — I10 Essential (primary) hypertension: Secondary | ICD-10-CM | POA: Insufficient documentation

## 2016-04-15 ENCOUNTER — Encounter: Payer: Self-pay | Admitting: *Deleted

## 2016-04-18 ENCOUNTER — Encounter: Payer: Self-pay | Admitting: Cardiology

## 2016-04-18 ENCOUNTER — Ambulatory Visit (INDEPENDENT_AMBULATORY_CARE_PROVIDER_SITE_OTHER): Payer: BLUE CROSS/BLUE SHIELD | Admitting: Cardiology

## 2016-04-18 VITALS — BP 138/80 | HR 69 | Ht 72.0 in | Wt 171.0 lb

## 2016-04-18 DIAGNOSIS — F17201 Nicotine dependence, unspecified, in remission: Secondary | ICD-10-CM

## 2016-04-18 DIAGNOSIS — I10 Essential (primary) hypertension: Secondary | ICD-10-CM

## 2016-04-18 DIAGNOSIS — I480 Paroxysmal atrial fibrillation: Secondary | ICD-10-CM | POA: Diagnosis not present

## 2016-04-18 DIAGNOSIS — Z136 Encounter for screening for cardiovascular disorders: Secondary | ICD-10-CM

## 2016-04-18 DIAGNOSIS — Z01810 Encounter for preprocedural cardiovascular examination: Secondary | ICD-10-CM | POA: Diagnosis not present

## 2016-04-18 MED ORDER — RIVAROXABAN 20 MG PO TABS: 20.0000 mg | ORAL_TABLET | Freq: Every day | ORAL | Status: DC

## 2016-04-18 MED ORDER — METOPROLOL SUCCINATE ER 50 MG PO TB24: 50.0000 mg | ORAL_TABLET | Freq: Every day | ORAL | Status: DC

## 2016-04-18 NOTE — Patient Instructions (Signed)
Your physician recommends that you continue on your current medications as directed. Please refer to the Current Medication list given to you today. Your physician recommends that you have lab work in 4 months just before your next visit to check your BMET and CBC. You will be contacted in about 2 months to have this completed. Your physician recommends that you schedule a follow-up appointment in: 4 months. You will receive a reminder letter in the mail in about 2 months reminding you to call and schedule your appointment. If you don't receive this letter, please contact our office. 90 day supply refills sent today for toprol xl & xarelto to [facility]. A 30 days supply refill was sent to [facility] for xarelto.

## 2016-04-18 NOTE — Progress Notes (Signed)
Cardiology Office Note  Date: 04/18/2016   ID: Robert Gonzalez, DOB 01-13-1941, MRN [id number]  PCP: Robert Lair, MD  Consulting Cardiologist: Robert Lesches, MD   Chief Complaint  Patient presents with  . Paroxysmal atrial fibrillation  . Preoperative evaluation    History of Present Illness: Robert Gonzalez is a 75 y.o. male referred for cardiology consultation by Dr. Scotty Gonzalez. He is a former patient of the [facility] cardiology practice, has seen Robert Gonzalez recently following evaluation at [facility] in April with newly diagnosed paroxysmal atrial fibrillation. He has been on Xarelto since that time.  I reviewed his recent cardiac testing including ECG, echocardiogram, and a Myoview with results outlined below. CHADSVASC score is 3.  He states that he is to undergo right knee replacement with Dr. Case at [facility], potentially on June 20.  He does not report any significant palpitations since April, no exertional chest pain. Has NYHA class II dyspnea at baseline. 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  I reviewed his ECG today which shows sinus rhythm with nonspecific ST changes and PAC.  Past Medical History  Diagnosis Date  . Arthritis   . Prostate cancer ([place])   . COPD (chronic obstructive pulmonary disease) ([place])   . Sleep apnea   . Paroxysmal atrial fibrillation [facility])     Diagnosed April 2017  . Essential hypertension     Past Surgical History  Procedure Laterality Date  . Prostatectomy    . Colonoscopy    . Polypectomy    . Hernia repair Bilateral   . Fracture surgery      Right arm    Current Outpatient Prescriptions  Medication Sig Dispense Refill  . lisinopril (PRINIVIL,ZESTRIL) 5 MG tablet Take 5 mg by mouth daily.    . metoprolol succinate (TOPROL-XL) 50 MG 24 hr tablet Take 1 tablet (50 mg total) by mouth daily. Take with or immediately following a meal. 90 tablet 3  . Polyethylene Glycol 3350 (MIRALAX PO) Take by  mouth every other day.    . rivaroxaban (XARELTO) 20 MG TABS tablet Take 1 tablet (20 mg total) by mouth daily with supper. 30 tablet 0   No current facility-administered medications for this visit.   Allergies:  Review of patient's allergies indicates no known allergies.   Social History: The patient  reports that he quit smoking about 12 years ago. His smoking use included Cigarettes. He has never used smokeless tobacco. He reports that he does not drink alcohol or use illicit drugs.   Family History: The patient's family history is negative for Colon cancer, Esophageal cancer, Stomach cancer, Rectal cancer, and Colon polyps.   ROS:  Please see the history of present illness. Otherwise, complete review of systems is positive for knee pain.  All other systems are reviewed and negative.   Physical Exam: VS:  BP 138/80 mmHg  Pulse 69  Ht 6' (1.829 m)  Wt 171 lb (77.565 kg)  BMI 23.19 kg/m2  SpO2 98%, BMI Body mass index is 23.19 kg/(m^2).  Wt Readings from Last 3 Encounters:  04/18/16 171 lb (77.565 kg)  05/22/15 163 lb (73.936 kg)  05/08/15 163 lb 12.8 oz (74.299 kg)    General: Patient appears comfortable at rest. HEENT: Conjunctiva and lids normal, oropharynx clear. Neck: Supple, no elevated JVP or carotid bruits, no thyromegaly. Lungs: Clear to auscultation, nonlabored breathing at rest. Cardiac: Regular rate and rhythm with occasional ectopy, no S3 or significant systolic murmur, no  pericardial rub. Abdomen: Soft, nontender, bowel sounds present, no guarding or rebound. Extremities: No pitting edema, distal pulses 2+. Skin: Warm and dry. Musculoskeletal: No kyphosis. Neuropsychiatric: Alert and oriented x3, affect grossly appropriate.  ECG: I personally reviewed the tracing from 03/08/2016 at [facility] which showed coarse atrial fibrillation versus flutter with RVR and nonspecific ST segment changes.  Recent Labwork:  April 2017: BUN 19, creatinine 0.9, potassium 4.5, AST  20, ALT 23, TSH 3.2, troponin T negative 3, hemoglobin 14.8, platelets 255  Other Studies Reviewed Today:  Echocardiogram 03/11/2016 [facility]): LVEF 60-65%, no significant valvular abnormalities.  Chest x-ray 03/08/2016 [facility]): Bilateral basilar atelectasis versus scarring with stable apical pleural parenchymal scarring, no superimposed infiltrate.  Lexiscan Myoview 03/29/2016 [facility]): No diagnostic ST segment changes or perfusion abnormalities to indicate ischemia with LVEF 60%.  Assessment and Plan:  1. Paroxysmal atrial fibrillation diagnosed in April. Maintaining sinus rhythm at present. Agree with current regimen including Toprol-XL and Xarelto. I reviewed his baseline lab work from April.  2. Preoperative evaluation prior to right knee replacement with Dr. Case at [facility] later this month. Recent cardiac testing shows normal LVEF and no significant ischemia. He should be able to proceed at an acceptable perioperative cardiac risk. Recommend holding Xarelto 24-48 hours prior to operation. He should stay on beta blocker therapy throughout if possible.  3. Essential hypertension, also on lisinopril with follow-up per Dr. Scotty Gonzalez.  4. History of COPD with remote tobacco use.  Current medicines were reviewed with the patient today.   Orders Placed This Encounter  Procedures  . EKG 12-Lead    Disposition: FU with me in 4 months.   Signed, Satira Sark, MD, [facility] 04/18/2016 8:51 AM    [place] [facility] [place], [place], [place] [address] Phone: [phone]; Fax: [phone]

## 2016-08-18 ENCOUNTER — Ambulatory Visit (INDEPENDENT_AMBULATORY_CARE_PROVIDER_SITE_OTHER): Payer: Medicare Other | Admitting: Cardiology

## 2016-08-18 ENCOUNTER — Encounter: Payer: Self-pay | Admitting: Cardiology

## 2016-08-18 VITALS — BP 134/64 | HR 61 | Ht 72.0 in | Wt 163.0 lb

## 2016-08-18 DIAGNOSIS — I480 Paroxysmal atrial fibrillation: Secondary | ICD-10-CM | POA: Diagnosis not present

## 2016-08-18 DIAGNOSIS — I10 Essential (primary) hypertension: Secondary | ICD-10-CM

## 2016-08-18 NOTE — Patient Instructions (Signed)

## 2016-08-18 NOTE — Progress Notes (Signed)
Cardiology Office Note  Date: 08/18/2016   ID: Robert Gonzalez, DOB 1941/11/14, MRN [id number]  PCP: Deloria Lair, MD  Primary Cardiologist: Rozann Lesches, MD   Chief Complaint  Patient presents with  . PAF    History of Present Illness: Robert Gonzalez is a 75 y.o. male seen as a new patient back in June. He presents for a routine follow-up visit. Continues to do well without palpitations or chest pain. He tells me that he did undergo right knee replacement with Dr. Case. Has been doing rehabilitation and trying to get more range of motion since then.  I reviewed his medications. He continues on Toprol-XL and Xarelto. He does not report any bleeding problems. States that he will be having lab work with Dr. Scotty Court in the next few months.  Past Medical History:  Diagnosis Date  . Arthritis   . COPD (chronic obstructive pulmonary disease) ([place])   . Essential hypertension   . Paroxysmal atrial fibrillation [facility])    Diagnosed April 2017  . Prostate cancer ([place])   . Sleep apnea     Current Outpatient Prescriptions  Medication Sig Dispense Refill  . lisinopril (PRINIVIL,ZESTRIL) 5 MG tablet Take 5 mg by mouth daily.    . metoprolol succinate (TOPROL-XL) 50 MG 24 hr tablet Take 1 tablet (50 mg total) by mouth daily. Take with or immediately following a meal. 90 tablet 3  . Polyethylene Glycol 3350 (MIRALAX PO) Take by mouth every other day.    . rivaroxaban (XARELTO) 20 MG TABS tablet Take 1 tablet (20 mg total) by mouth daily with supper. 30 tablet 0   No current facility-administered medications for this visit.    Allergies:  Review of patient's allergies indicates no known allergies.   Social History: The patient  reports that he quit smoking about 12 years ago. His smoking use included Cigarettes. He has never used smokeless tobacco. He reports that he does not drink alcohol or use drugs.   ROS:  Please see the history of present illness. Otherwise, complete  review of systems is positive for improving right knee pain..  All other systems are reviewed and negative.   Physical Exam: VS:  BP 134/64   Pulse 61   Ht 6' (1.829 m)   Wt 163 lb (73.9 kg)   SpO2 92%   BMI 22.11 kg/m , BMI Body mass index is 22.11 kg/m.  Wt Readings from Last 3 Encounters:  08/18/16 163 lb (73.9 kg)  04/18/16 171 lb (77.6 kg)  05/22/15 163 lb (73.9 kg)    General: Patient appears comfortable at rest. HEENT: Conjunctiva and lids normal, oropharynx clear. Neck: Supple, no elevated JVP or carotid bruits, no thyromegaly. Lungs: Clear to auscultation, nonlabored breathing at rest. Cardiac: Regular rate and rhythm with occasional ectopy, no S3 or significant systolic murmur, no pericardial rub. Abdomen: Soft, nontender, bowel sounds present, no guarding or rebound. Extremities: No pitting edema, distal pulses 2+.  ECG: I personally reviewed the tracing from 04/18/2016 which showed sinus rhythm with PACs and nonspecific ST changes.  Recent Labwork:  April 2017: BUN 19, creatinine 0.9, potassium 4.5, AST 20, ALT 23, TSH 3.2, troponin T negative 3, hemoglobin 14.8, platelets 255  Other Studies Reviewed Today:  Echocardiogram 03/11/2016 [facility]): LVEF 60-65%, no significant valvular abnormalities.  Lexiscan Myoview 03/29/2016 [facility]): No diagnostic ST segment changes or perfusion abnormalities to indicate ischemia with LVEF 60%.  Assessment and Plan:  1. Paroxysmal atrial fibrillation, no significant palpitations reported. He  continues on Toprol-XL and Xarelto, no reported bleeding problems. We will continue observation, no changes made today.  2. Essential hypertension, blood pressure control is adequate today. Keep follow-up with Dr. Scotty Court.  Current medicines were reviewed with the patient today.  Disposition: Follow-up with me in 6 months.  Signed, Satira Sark, MD, [facility] 08/18/2016 1:50 PM    [facility] at [facility] [address] [place], [place] [address] Phone: [phone]; Fax: [phone]

## 2016-08-23 ENCOUNTER — Ambulatory Visit: Payer: BLUE CROSS/BLUE SHIELD | Admitting: Cardiology

## 2017-02-17 ENCOUNTER — Encounter: Payer: Self-pay | Admitting: *Deleted

## 2017-02-20 ENCOUNTER — Ambulatory Visit (INDEPENDENT_AMBULATORY_CARE_PROVIDER_SITE_OTHER): Payer: BLUE CROSS/BLUE SHIELD | Admitting: Cardiology

## 2017-02-20 ENCOUNTER — Encounter: Payer: Self-pay | Admitting: *Deleted

## 2017-02-20 ENCOUNTER — Encounter: Payer: Self-pay | Admitting: Cardiology

## 2017-02-20 VITALS — BP 130/84 | HR 60 | Ht 72.0 in | Wt 177.0 lb

## 2017-02-20 DIAGNOSIS — F17201 Nicotine dependence, unspecified, in remission: Secondary | ICD-10-CM | POA: Diagnosis not present

## 2017-02-20 DIAGNOSIS — I10 Essential (primary) hypertension: Secondary | ICD-10-CM

## 2017-02-20 DIAGNOSIS — G4733 Obstructive sleep apnea (adult) (pediatric): Secondary | ICD-10-CM

## 2017-02-20 DIAGNOSIS — I480 Paroxysmal atrial fibrillation: Secondary | ICD-10-CM | POA: Diagnosis not present

## 2017-02-20 MED ORDER — RIVAROXABAN 20 MG PO TABS: 20.0000 mg | ORAL_TABLET | Freq: Every day | ORAL | 3 refills | Status: DC

## 2017-02-20 MED ORDER — METOPROLOL SUCCINATE ER 50 MG PO TB24: 50.0000 mg | ORAL_TABLET | Freq: Every day | ORAL | 3 refills | Status: DC

## 2017-02-20 NOTE — Progress Notes (Signed)
Cardiology Office Note  Date: 02/20/2017   ID: Robert Gonzalez, DOB 14-May-1941, MRN [id number]  PCP: Deloria Lair, MD  Primary Cardiologist: Rozann Lesches, MD   Chief Complaint  Patient presents with  . Paroxysmal atrial fibrillation    History of Present Illness: Robert Gonzalez is a 76 y.o. male last seen in October 2017. He presents for a routine follow-up visit. Reports an occasional feeling of brief palpitations, no chest pain or syncope. He walks for exercise. Reports NYHA class II dyspnea.  He continues on Xarelto for stroke prophylaxis with paroxysmal atrial fibrillation. No reported bleeding episodes. He had recent lab work done with Dr. Scotty Court, results requested.  Blood pressure today is adequately controlled. He continues on lisinopril and Toprol-XL.  Past Medical History:  Diagnosis Date  . Arthritis   . COPD (chronic obstructive pulmonary disease) ([place])   . Essential hypertension   . Paroxysmal atrial fibrillation [facility])    Diagnosed April 2017  . Prostate cancer ([place])   . Sleep apnea     Past Surgical History:  Procedure Laterality Date  . COLONOSCOPY    . FRACTURE SURGERY     Right arm  . HERNIA REPAIR Bilateral   . POLYPECTOMY    . PROSTATECTOMY      Current Outpatient Prescriptions  Medication Sig Dispense Refill  . lisinopril (PRINIVIL,ZESTRIL) 5 MG tablet Take 5 mg by mouth daily. GETS REFILLS FROM [facility]    . metoprolol succinate (TOPROL-XL) 50 MG 24 hr tablet Take 1 tablet (50 mg total) by mouth daily. 90 tablet 3  . Polyethylene Glycol 3350 (MIRALAX PO) Take by mouth every other day.    . rivaroxaban (XARELTO) 20 MG TABS tablet Take 1 tablet (20 mg total) by mouth daily with supper. 90 tablet 3   No current facility-administered medications for this visit.    Allergies:  Patient has no known allergies.   Social History: The patient  reports that he quit smoking about 13 years ago. His smoking use included Cigarettes. He has never  used smokeless tobacco. He reports that he does not drink alcohol or use drugs.   ROS:  Please see the history of present illness. Otherwise, complete review of systems is positive for chronic right knee pain.  All other systems are reviewed and negative.   Physical Exam: VS:  BP 130/84   Pulse 60   Ht 6' (1.829 m)   Wt 177 lb (80.3 kg)   SpO2 99%   BMI 24.01 kg/m , BMI Body mass index is 24.01 kg/m.  Wt Readings from Last 3 Encounters:  02/20/17 177 lb (80.3 kg)  08/18/16 163 lb (73.9 kg)  04/18/16 171 lb (77.6 kg)    General: Patient appears comfortable at rest. HEENT: Conjunctiva and lids normal, oropharynx clear. Neck: Supple, no elevated JVP or carotid bruits, no thyromegaly. Lungs: Clear to auscultation, nonlabored breathing at rest. Cardiac: Regular rate and rhythm, no S3 or significant systolic murmur, no pericardial rub. Abdomen: Soft, nontender, bowel sounds present, no guarding or rebound. Extremities: No pitting edema, distal pulses 2+. Skin: Warm and dry. Musculoskeletal: No kyphosis. Neuropsychiatric: Alert and oriented 3, affect appropriate.  ECG: I personally reviewed the tracing from 04/18/2016 which shows sinus rhythm with PAC and nonspecific ST changes.  Recent Labwork:  April 2017: BUN 19, creatinine 0.9, potassium 4.5, AST 20, ALT 23, TSH 3.2, troponin T negative 3, hemoglobin 14.8, platelets 255  Other Studies Reviewed Today:  Echocardiogram 03/11/2016 [facility]): LVEF 60-65%, no  significant valvular abnormalities.  Lexiscan Myoview 03/29/2016 [facility]): No diagnostic ST segment changes or perfusion abnormalities to indicate ischemia with LVEF 60%.  Assessment and Plan:  1. Paroxysmal atrial fibrillation, CHADSVASC score is 2. He continues to do well on Toprol-XL and Xarelto, no bleeding problems. Recent lab work requested from Dr. Scotty Court.  2. Essential hypertension, systolic blood pressure in the 130s today. Continue lisinopril and  Toprol-XL.  3. Tobacco abuse in remission. Patient has history of COPD.  4. Obstructive sleep apnea, followed by Dr. Scotty Court.  Current medicines were reviewed with the patient today.  Disposition: Follow-up in 6 months.  Signed, Satira Sark, MD, [facility] 02/20/2017 8:34 AM    [place] [facility] [place], [place], [place] [address] Phone: [phone]; Fax: [phone]

## 2017-02-20 NOTE — Patient Instructions (Signed)

## 2017-08-21 ENCOUNTER — Encounter: Payer: Self-pay | Admitting: *Deleted

## 2017-08-22 ENCOUNTER — Ambulatory Visit (INDEPENDENT_AMBULATORY_CARE_PROVIDER_SITE_OTHER): Payer: BLUE CROSS/BLUE SHIELD | Admitting: Cardiology

## 2017-08-22 ENCOUNTER — Encounter: Payer: Self-pay | Admitting: Cardiology

## 2017-08-22 VITALS — BP 128/78 | HR 54 | Ht 72.0 in | Wt 168.0 lb

## 2017-08-22 DIAGNOSIS — F17201 Nicotine dependence, unspecified, in remission: Secondary | ICD-10-CM

## 2017-08-22 DIAGNOSIS — I10 Essential (primary) hypertension: Secondary | ICD-10-CM | POA: Diagnosis not present

## 2017-08-22 DIAGNOSIS — I480 Paroxysmal atrial fibrillation: Secondary | ICD-10-CM

## 2017-08-22 DIAGNOSIS — G4733 Obstructive sleep apnea (adult) (pediatric): Secondary | ICD-10-CM | POA: Diagnosis not present

## 2017-08-22 NOTE — Progress Notes (Signed)
Cardiology Office Note  Date: 08/22/2017   ID: Robert Gonzalez, DOB 10/23/1941, MRN [id number]  PCP: Deloria Lair., MD  Primary Cardiologist: Rozann Lesches, MD   Chief Complaint  Patient presents with  . PAF    History of Present Illness: Robert Gonzalez is a 76 y.o. male last seen in April. He presents for a routine follow-up visit. Reports no progressive palpitations or breathlessness since last encounter. He has had no exertional chest pain. Remains functional with ADLs. He denies any falls or bleeding problems.  We reviewed his medications which are outlined below. He reports compliance.  I reviewed his lab work from April as outlined below.  I personally reviewed his ECG today which shows sinus bradycardia with low voltage in the limb leads.  Past Medical History:  Diagnosis Date  . Arthritis   . COPD (chronic obstructive pulmonary disease) ([place])   . Essential hypertension   . Paroxysmal atrial fibrillation [facility])    Diagnosed April 2017  . Prostate cancer ([place])   . Sleep apnea     Past Surgical History:  Procedure Laterality Date  . COLONOSCOPY    . FRACTURE SURGERY     Right arm  . HERNIA REPAIR Bilateral   . POLYPECTOMY    . PROSTATECTOMY      Current Outpatient Prescriptions  Medication Sig Dispense Refill  . lisinopril (PRINIVIL,ZESTRIL) 5 MG tablet Take 5 mg by mouth daily. GETS REFILLS FROM [facility]    . metoprolol succinate (TOPROL-XL) 50 MG 24 hr tablet Take 1 tablet (50 mg total) by mouth daily. 90 tablet 3  . Polyethylene Glycol 3350 (MIRALAX PO) Take by mouth every other day.    . rivaroxaban (XARELTO) 20 MG TABS tablet Take 1 tablet (20 mg total) by mouth daily with supper. 90 tablet 3   No current facility-administered medications for this visit.    Allergies:  Patient has no known allergies.   Social History: The patient  reports that he quit smoking about 13 years ago. His smoking use included Cigarettes. He has never used  smokeless tobacco. He reports that he does not drink alcohol or use drugs.   ROS:  Please see the history of present illness. Otherwise, complete review of systems is positive for none.  All other systems are reviewed and negative.   Physical Exam: VS:  BP 128/78   Pulse (!) 54   Ht 6' (1.829 m)   Wt 168 lb (76.2 kg)   SpO2 98%   BMI 22.78 kg/m , BMI Body mass index is 22.78 kg/m.  Wt Readings from Last 3 Encounters:  08/22/17 168 lb (76.2 kg)  02/20/17 177 lb (80.3 kg)  08/18/16 163 lb (73.9 kg)    General: Elderly male, appears comfortable at rest. HEENT: Conjunctiva and lids normal, oropharynx clear. Neck: Supple, no elevated JVP or carotid bruits, no thyromegaly. Lungs: No wheezing, nonlabored breathing at rest. Cardiac: Regular rate and rhythm, no S3 or significant systolic murmur, no pericardial rub. Abdomen: Soft, nontender, bowel sounds present, no guarding or rebound. Extremities: No pitting edema, distal pulses 2+. Skin: Warm and dry. Musculoskeletal: No kyphosis. Neuropsychiatric: Alert and oriented x3, affect grossly appropriate.  ECG: I personally reviewed the tracing from 04/18/2016 which showed sinus rhythm with PAC and nonspecific ST changes.  Recent Labwork:  April 2018: Hemoglobin 14.6, platelets 232, BUN 16, creatinine 0.81, potassium 4.9, AST 24, ALT 20, cholesterol 208, HDL 41, LDL 138, triglycerides 130   Other Studies Reviewed Today:  Echocardiogram 03/11/2016 [facility]): LVEF 60-65%, no significant valvular abnormalities.  Lexiscan Myoview 03/29/2016 [facility]): No diagnostic ST segment changes or perfusion abnormalities to indicate ischemia with LVEF 60%.  Assessment and Plan:  1. Symptomatically stable paroxysmal atrial fibrillation. Continue Toprol-XL and Xarelto. Follow-up CBC and BMET being arranged.  2. Essential hypertension, blood pressure is adequately controlled today. He continues on lisinopril.  3. Tobacco abuse in remission. He has  history of COPD with stable dyspnea on exertion.  4. Obstructive sleep apnea.  Current medicines were reviewed with the patient today.   Orders Placed This Encounter  Procedures  . Basic Metabolic Panel (BMET)  . CBC  . EKG 12-Lead    Disposition: Follow-up in 6 months.  Signed, Satira Sark, MD, [facility] 08/22/2017 9:29 AM    [place] at [place], [place], [place] [address] Phone: [phone]; Fax: [phone]

## 2017-08-22 NOTE — Patient Instructions (Addendum)
Medication Instructions:  Your physician recommends that you continue on your current medications as directed. Please refer to the Current Medication list given to you today.  Labwork:  CBC/BMET  ORDERS GIVEN TO PATIENT TODAY  Testing/Procedures: NONE  Follow-Up: Your physician wants you to follow-up in: [place]. [place]. You will receive a reminder letter in the mail two months in advance. If you don't receive a letter, please call our office to schedule the follow-up appointment.  Any Other Special Instructions Will Be Listed Below (If Applicable).  If you need a refill on your cardiac medications before your next appointment, please call your pharmacy.

## 2017-08-28 ENCOUNTER — Telehealth: Payer: Self-pay

## 2017-08-28 NOTE — Telephone Encounter (Signed)
Unable to LM

## 2017-08-28 NOTE — Telephone Encounter (Signed)
-----   Message from Merlene Laughter, LPN sent at 16/60/6301 11:59 AM EDT -----   ----- Message ----- From: Satira Sark, MD Sent: 08/28/2017   9:48 AM To: Merlene Laughter, LPN  Results reviewed. Renal function and hemoglobin are normal. Continue with current regimen. A copy of this test should be forwarded to Deloria Lair., MD.

## 2017-09-04 NOTE — Telephone Encounter (Signed)
Patient notified. Routed to PCP 

## 2017-09-04 NOTE — Telephone Encounter (Signed)
-----   Message from Merlene Laughter, LPN sent at 95/18/8416 11:59 AM EDT -----   ----- Message ----- From: Satira Sark, MD Sent: 08/28/2017   9:48 AM To: Merlene Laughter, LPN  Results reviewed. Renal function and hemoglobin are normal. Continue with current regimen. A copy of this test should be forwarded to Deloria Lair., MD.

## 2018-01-28 ENCOUNTER — Other Ambulatory Visit: Payer: Self-pay | Admitting: Cardiology

## 2018-01-31 ENCOUNTER — Other Ambulatory Visit: Payer: Self-pay | Admitting: Cardiology

## 2018-01-31 MED ORDER — RIVAROXABAN 20 MG PO TABS: ORAL_TABLET | ORAL | 1 refills | Status: DC

## 2018-01-31 MED ORDER — METOPROLOL SUCCINATE ER 50 MG PO TB24: 50.0000 mg | ORAL_TABLET | Freq: Every day | ORAL | 1 refills | Status: DC

## 2018-01-31 NOTE — Telephone Encounter (Signed)
Medication sent to pharmacy  

## 2018-01-31 NOTE — Telephone Encounter (Signed)
Patient walkin   *STAT* If patient is at the pharmacy, call can be transferred to refill team.   1. Which medications need to be refilled?  metoprolol succinate (TOPROL-XL) 50 MG 24 hr tablet   XARELTO 20 MG TABS tablet       Which pharmacy/location (including street and city if local pharmacy) is medication to be sent to? [facility]  3. Do they need a 30 day or 90 day supply?

## 2018-02-28 NOTE — Progress Notes (Signed)
Cardiology Office Note  Date: 03/01/2018   ID: [name], DOB 05/14/1941, MRN [id number]  PCP: Deloria Lair., MD  Primary Cardiologist: Rozann Lesches, MD   Chief Complaint  Patient presents with  . PAF    History of Present Illness: Robert Gonzalez is a 77 y.o. male last seen in October 2018.  He presents for a follow-up visit.  Reports no major change in health since last encounter.  He does not report any chest pain or palpitations, no dizziness or syncope.  Enjoys walking for exercise.  I reviewed his medications.  He remains on Toprol-XL and Xarelto.  No reported bleeding problems.  Follow-up lab work from October of last year is outlined below.  Past Medical History:  Diagnosis Date  . Arthritis   . COPD (chronic obstructive pulmonary disease) ([place])   . Essential hypertension   . Paroxysmal atrial fibrillation [facility])    Diagnosed April 2017  . Prostate cancer ([place])   . Sleep apnea     Past Surgical History:  Procedure Laterality Date  . COLONOSCOPY    . FRACTURE SURGERY     Right arm  . HERNIA REPAIR Bilateral   . POLYPECTOMY    . PROSTATECTOMY      Current Outpatient Medications  Medication Sig Dispense Refill  . Biotin 1000 MCG tablet Take 1,000 mcg by mouth daily.    Marland Kitchen lisinopril (PRINIVIL,ZESTRIL) 5 MG tablet Take 5 mg by mouth daily. GETS REFILLS FROM [facility]    . metoprolol succinate (TOPROL-XL) 50 MG 24 hr tablet Take 1 tablet (50 mg total) by mouth daily. Take with or immediately following a meal. 90 tablet 1  . Polyethylene Glycol 3350 (MIRALAX PO) Take by mouth every other day.    . rivaroxaban (XARELTO) 20 MG TABS tablet TAKE 1 TABLET BY MOUTH  DAILY WITH SUPPER 90 tablet 1   No current facility-administered medications for this visit.    Allergies:  Patient has no known allergies.   Social History: The patient  reports that he quit smoking about 14 years ago. His smoking use included cigarettes. He has never used smokeless  tobacco. He reports that he does not drink alcohol or use drugs.   ROS:  Please see the history of present illness. Otherwise, complete review of systems is positive for none.  All other systems are reviewed and negative.   Physical Exam: VS:  BP 136/74   Pulse (!) 56   Ht 6' (1.829 m)   Wt 170 lb (77.1 kg)   SpO2 97%   BMI 23.06 kg/m , BMI Body mass index is 23.06 kg/m.  Wt Readings from Last 3 Encounters:  03/01/18 170 lb (77.1 kg)  08/22/17 168 lb (76.2 kg)  02/20/17 177 lb (80.3 kg)    General: Elderly male, appears comfortable at rest. HEENT: Conjunctiva and lids normal, oropharynx clear. Neck: Supple, no elevated JVP or carotid bruits, no thyromegaly. Lungs: Clear to auscultation, nonlabored breathing at rest. Cardiac: Regular rate and rhythm, no S3 or significant systolic murmur, no pericardial rub. Abdomen: Soft, nontender, bowel sounds present. Extremities: No pitting edema, distal pulses 2+. Skin: Warm and dry. Musculoskeletal: No kyphosis. Neuropsychiatric: Alert and oriented x3, affect grossly appropriate.  ECG: I personally reviewed the tracing from 08/22/2017 which showed sinus bradycardia with low voltage in the limb leads.  Recent Labwork:  October 2018: BUN 16, creatinine 0.88, potassium 4.9, hemoglobin 14.1, platelets 246  Other Studies Reviewed Today:  Echocardiogram 03/11/2016 [facility]): LVEF  60-65%, no significant valvular abnormalities.  Lexiscan Myoview 03/29/2016 [facility]): No diagnostic ST segment changes or perfusion abnormalities to indicate ischemia with LVEF 60%.  Assessment and Plan:  1.  Paroxysmal atrial fibrillation with CHADSVASC score of 3.  He reports no palpitations or chest pain and continues on combination of Toprol-XL and Xarelto.  Follow-up CBC and BMET to be obtained.  2.  Essential hypertension, also on lisinopril and followed by PCP.  No changes made today.  3.  COPD with history of tobacco abuse in remission.  4.   Obstructive sleep apnea.  Current medicines were reviewed with the patient today.   Orders Placed This Encounter  Procedures  . CBC  . Basic metabolic panel    Disposition: Follow-up in 6 months.  Signed, Satira Sark, MD, [facility] 03/01/2018 9:58 AM    [place] at [place], [place], [place] [address] Phone: [phone]; Fax: [phone]

## 2018-03-01 ENCOUNTER — Ambulatory Visit (INDEPENDENT_AMBULATORY_CARE_PROVIDER_SITE_OTHER): Payer: BLUE CROSS/BLUE SHIELD | Admitting: Cardiology

## 2018-03-01 ENCOUNTER — Encounter: Payer: Self-pay | Admitting: Cardiology

## 2018-03-01 VITALS — BP 136/74 | HR 56 | Ht 72.0 in | Wt 170.0 lb

## 2018-03-01 DIAGNOSIS — I10 Essential (primary) hypertension: Secondary | ICD-10-CM | POA: Diagnosis not present

## 2018-03-01 DIAGNOSIS — G4733 Obstructive sleep apnea (adult) (pediatric): Secondary | ICD-10-CM | POA: Diagnosis not present

## 2018-03-01 DIAGNOSIS — I480 Paroxysmal atrial fibrillation: Secondary | ICD-10-CM | POA: Diagnosis not present

## 2018-03-01 DIAGNOSIS — F17201 Nicotine dependence, unspecified, in remission: Secondary | ICD-10-CM | POA: Diagnosis not present

## 2018-03-01 DIAGNOSIS — Z7901 Long term (current) use of anticoagulants: Secondary | ICD-10-CM | POA: Diagnosis not present

## 2018-03-01 NOTE — Patient Instructions (Signed)
Medication Instructions:  Continue all current medications.  Labwork:  CBC. BMET - orders given today.  Office will contact with results via phone or letter.    Testing/Procedures: none  Follow-Up: Your physician wants you to follow up in: 6 months.  You will receive a reminder letter in the mail one-two months in advance.  If you don't receive a letter, please call our office to schedule the follow up appointment   Any Other Special Instructions Will Be Listed Below (If Applicable).  If you need a refill on your cardiac medications before your next appointment, please call your pharmacy.

## 2018-08-27 ENCOUNTER — Encounter: Payer: Self-pay | Admitting: *Deleted

## 2018-08-27 NOTE — Progress Notes (Signed)
Cardiology Office Note  Date: 08/28/2018   ID: Robert Gonzalez, DOB 06/12/1941, MRN [id number]  PCP: Loman [place], FNP  Primary Cardiologist: Rozann Lesches, MD   Chief Complaint  Patient presents with  . PAF    History of Present Illness: Robert Gonzalez is a 77 y.o. male last seen in April.  He presents for a routine visit.  Reports no palpitations or chest pain, no change in stamina or increasing shortness of breath.  I reviewed his medications, he continues on Xarelto as well as Toprol-XL.  I personally reviewed his ECG today which shows sinus bradycardia with nonspecific T wave changes.  He is due for follow-up CBC and BMET on Xarelto.  He states that he is following with PCP next month and will get this obtained then.  He does not describe any bleeding problems, no changes in stools.  Past Medical History:  Diagnosis Date  . Arthritis   . COPD (chronic obstructive pulmonary disease) ([place])   . Essential hypertension   . Paroxysmal atrial fibrillation [facility])    Diagnosed April 2017  . Prostate cancer ([place])   . Sleep apnea     Past Surgical History:  Procedure Laterality Date  . COLONOSCOPY    . FRACTURE SURGERY     Right arm  . HERNIA REPAIR Bilateral   . POLYPECTOMY    . PROSTATECTOMY      Current Outpatient Medications  Medication Sig Dispense Refill  . Biotin 1000 MCG tablet Take 1,000 mcg by mouth daily.    Marland Kitchen lisinopril (PRINIVIL,ZESTRIL) 5 MG tablet Take 5 mg by mouth daily. GETS REFILLS FROM [facility]    . metoprolol succinate (TOPROL-XL) 50 MG 24 hr tablet Take 1 tablet (50 mg total) by mouth daily. Take with or immediately following a meal. 90 tablet 1  . Polyethylene Glycol 3350 (MIRALAX PO) Take by mouth every other day.    . rivaroxaban (XARELTO) 20 MG TABS tablet TAKE 1 TABLET BY MOUTH  DAILY WITH SUPPER 90 tablet 1   No current facility-administered medications for this visit.    Allergies:  Patient has no known allergies.   Social  History: The patient  reports that he quit smoking about 14 years ago. His smoking use included cigarettes. He has never used smokeless tobacco. He reports that he does not drink alcohol or use drugs.   ROS:  Please see the history of present illness. Otherwise, complete review of systems is positive for none.  All other systems are reviewed and negative.   Physical Exam: VS:  BP 118/70   Pulse (!) 58   Ht 6' (1.829 m)   Wt 170 lb (77.1 kg)   SpO2 98%   BMI 23.06 kg/m , BMI Body mass index is 23.06 kg/m.  Wt Readings from Last 3 Encounters:  08/28/18 170 lb (77.1 kg)  03/01/18 170 lb (77.1 kg)  08/22/17 168 lb (76.2 kg)    General: Elderly male, appears comfortable at rest. HEENT: Conjunctiva and lids normal, oropharynx clear. Neck: Supple, no elevated JVP or carotid bruits, no thyromegaly. Lungs: Clear to auscultation, nonlabored breathing at rest. Cardiac: Regular rate and rhythm, no S3 or significant systolic murmur. Abdomen: Soft, nontender, bowel sounds present. Extremities: No pitting edema, distal pulses 2+.  ECG: I personally reviewed the tracing from 08/22/2017 which showed sinus bradycardia with low voltage in the limb leads.  Recent Labwork:  October 2018: BUN 16, creatinine 0.88, potassium 4.9, hemoglobin 14.1, platelets 246  Other  Studies Reviewed Today:  Echocardiogram 03/11/2016 [facility]): LVEF 60-65%, no significant valvular abnormalities.  Lexiscan Myoview 03/29/2016 [facility]): No diagnostic ST segment changes or perfusion abnormalities to indicate ischemia with LVEF 60%.  Assessment and Plan:  1.  Paroxysmal atrial fibrillation.  He is doing well without recurrent palpitations and is in sinus rhythm today by ECG.  Continue Toprol-XL and Xarelto.  He needs follow-up CBC with BMET in the next month.  2.  Essential hypertension, blood pressure is well controlled today.  He is also on lisinopril.  Keep follow-up with PCP.  Current medicines were reviewed  with the patient today.   Orders Placed This Encounter  Procedures  . EKG 12-Lead    Disposition: Follow-up in 6 months.  Signed, Satira Sark, MD, [facility] 08/28/2018 9:37 AM    [place] at [place], [place], [place] [address] Phone: [phone]; Fax: [phone]

## 2018-08-28 ENCOUNTER — Ambulatory Visit (INDEPENDENT_AMBULATORY_CARE_PROVIDER_SITE_OTHER): Payer: BLUE CROSS/BLUE SHIELD | Admitting: Cardiology

## 2018-08-28 ENCOUNTER — Encounter: Payer: Self-pay | Admitting: Cardiology

## 2018-08-28 VITALS — BP 118/70 | HR 58 | Ht 72.0 in | Wt 170.0 lb

## 2018-08-28 DIAGNOSIS — I10 Essential (primary) hypertension: Secondary | ICD-10-CM | POA: Diagnosis not present

## 2018-08-28 DIAGNOSIS — I480 Paroxysmal atrial fibrillation: Secondary | ICD-10-CM

## 2018-08-28 NOTE — Patient Instructions (Signed)

## 2018-09-03 DIAGNOSIS — S76111A Strain of right quadriceps muscle, fascia and tendon, initial encounter: Secondary | ICD-10-CM | POA: Insufficient documentation

## 2018-11-22 ENCOUNTER — Other Ambulatory Visit: Payer: Self-pay | Admitting: Cardiology

## 2018-12-26 ENCOUNTER — Other Ambulatory Visit: Payer: Self-pay | Admitting: Cardiology

## 2019-02-26 ENCOUNTER — Telehealth: Payer: Self-pay | Admitting: *Deleted

## 2019-02-26 NOTE — Telephone Encounter (Signed)
Pt contacted per Dr Domenic Polite. History reviewed. No symptoms to suggest any unstable cardiac conditions. Based on discussion, with current pandemic situation, we have postponed 02/26/19 appointment until July 2020. If symptoms change, pt has been instructed to contact our office

## 2019-03-01 ENCOUNTER — Ambulatory Visit: Payer: BLUE CROSS/BLUE SHIELD | Admitting: Cardiology

## 2019-04-15 ENCOUNTER — Other Ambulatory Visit: Payer: Self-pay | Admitting: Cardiology

## 2019-05-23 ENCOUNTER — Other Ambulatory Visit: Payer: Self-pay | Admitting: Cardiology

## 2019-06-03 ENCOUNTER — Ambulatory Visit: Payer: Medicare Other | Admitting: Cardiology

## 2019-06-03 ENCOUNTER — Encounter: Payer: Self-pay | Admitting: Cardiology

## 2019-06-03 ENCOUNTER — Other Ambulatory Visit: Payer: Self-pay

## 2019-06-03 VITALS — BP 100/68 | HR 74 | Temp 98.1°F | Ht 72.0 in | Wt 162.2 lb

## 2019-06-03 DIAGNOSIS — I480 Paroxysmal atrial fibrillation: Secondary | ICD-10-CM | POA: Diagnosis not present

## 2019-06-03 DIAGNOSIS — I10 Essential (primary) hypertension: Secondary | ICD-10-CM | POA: Diagnosis not present

## 2019-06-03 NOTE — Patient Instructions (Addendum)

## 2019-06-03 NOTE — Progress Notes (Signed)
Cardiology Office Note  Date: 06/03/2019   ID: AKSHAJ Gonzalez, DOB [birth year], MRN [id number]  PCP:  Loman [place], FNP  Cardiologist:  Rozann Lesches, MD Electrophysiologist:  None   Chief Complaint  Patient presents with  . Atrial Fibrillation    History of Present Illness: Robert Gonzalez is a 78 y.o. male last seen in October 2019.  He presents for a routine visit.  He does not report any palpitations or chest pain, remains active with ADLs.  He also walks for at least 45 minutes most mornings for exercise.  We discussed follow-up lab work on [place].  He does not report any bleeding problems or changes in stool.  States that he has pending lab work with his PCP in the next few months.  I reviewed his medications which are outlined below.  No changes were made today.  Past Medical History:  Diagnosis Date  . Arthritis   . COPD (chronic obstructive pulmonary disease) ([place])   . Essential hypertension   . Paroxysmal atrial fibrillation [facility])    Diagnosed April 2017  . Prostate cancer ([place])   . Sleep apnea     Past Surgical History:  Procedure Laterality Date  . COLONOSCOPY    . FRACTURE SURGERY     Right arm  . HERNIA REPAIR Bilateral   . POLYPECTOMY    . PROSTATECTOMY      Current Outpatient Medications  Medication Sig Dispense Refill  . Biotin 1000 MCG tablet Take 1,000 mcg by mouth daily.    Marland Kitchen lisinopril (PRINIVIL,ZESTRIL) 5 MG tablet Take 5 mg by mouth daily. GETS REFILLS FROM [facility]    . metoprolol succinate (TOPROL-XL) 50 MG 24 hr tablet TAKE 1 TABLET BY MOUTH  DAILY 90 tablet 1  . Polyethylene Glycol 3350 (MIRALAX PO) Take by mouth every other day.    Alveda Reasons 20 MG TABS tablet TAKE 1 TABLET BY MOUTH  DAILY WITH SUPPER 90 tablet 1   No current facility-administered medications for this visit.    Allergies:  Patient has no known allergies.   Social History: The patient  reports that he quit smoking about 15 years ago. His smoking use  included cigarettes. He has never used smokeless tobacco. He reports that he does not drink alcohol or use drugs.   ROS:  Please see the history of present illness. Otherwise, complete review of systems is positive for none.  All other systems are reviewed and negative.   Physical Exam: VS:  BP 100/68   Pulse 74   Temp 98.1 F (36.7 C)   Ht 6' (1.829 m)   Wt 162 lb 3.2 oz (73.6 kg)   SpO2 98%   BMI 22.00 kg/m , BMI Body mass index is 22 kg/m.  Wt Readings from Last 3 Encounters:  06/03/19 162 lb 3.2 oz (73.6 kg)  08/28/18 170 lb (77.1 kg)  03/01/18 170 lb (77.1 kg)    General: Elderly male, appears comfortable at rest. HEENT: Conjunctiva and lids normal, wearing a mask. Neck: Supple, no elevated JVP or carotid bruits, no thyromegaly. Lungs: Clear to auscultation, nonlabored breathing at rest. Cardiac: Regular rate and rhythm with ectopy, no S3, soft systolic murmur. Abdomen: Soft, nontender, bowel sounds present. Extremities: No pitting edema, distal pulses 2+. Skin: Warm and dry. Musculoskeletal: No kyphosis. Neuropsychiatric: Alert and oriented x3, affect grossly appropriate.  ECG:  An ECG dated 08/28/2018 was personally reviewed today and demonstrated:  Sinus bradycardia with nonspecific T wave changes.  Recent Labwork:  No recent lab work for review.  Other Studies Reviewed Today:  Echocardiogram 03/11/2016 [facility] [place] [facility]): LVEF 60-65%, no significant valvular abnormalities.  Lexiscan Myoview 03/29/2016 [facility]): No diagnostic ST segment changes or perfusion abnormalities to indicate ischemia with LVEF 60%.  Assessment and Plan:  1.  Paroxysmal atrial fibrillation.  He reports no significant palpitations on current regimen including Toprol-XL.  He remains on Xarelto for stroke prophylaxis.  Follow-up lab work with PCP as planned.  2.  Essential hypertension, blood pressure is low normal today.  He is asymptomatic.  Medication Adjustments/Labs and Tests Ordered:  Current medicines are reviewed at length with the patient today.  Concerns regarding medicines are outlined above.   Tests Ordered: No orders of the defined types were placed in this encounter.   Medication Changes: No orders of the defined types were placed in this encounter.   Disposition:  Follow up 6 months in the [place] office.  Signed, Satira Sark, MD, [facility] 06/03/2019 2:01 PM    [facility] at [place], [place],  [address] Phone: [phone]; Fax: [phone]

## 2019-09-28 ENCOUNTER — Other Ambulatory Visit: Payer: Self-pay | Admitting: Cardiology

## 2019-11-01 DIAGNOSIS — M25061 Hemarthrosis, right knee: Secondary | ICD-10-CM | POA: Insufficient documentation

## 2019-11-17 ENCOUNTER — Other Ambulatory Visit: Payer: Self-pay | Admitting: Cardiology

## 2019-11-19 ENCOUNTER — Encounter: Payer: Self-pay | Admitting: *Deleted

## 2019-11-19 ENCOUNTER — Telehealth: Payer: Self-pay | Admitting: Cardiology

## 2019-11-19 NOTE — Telephone Encounter (Signed)
Tore quad in right leg one month ago and was told to stop xarelto by Dr. Case. Currently taking aspirin 81 mg twice daily. Per patient, he was told by Dr. Case to stay off [place] indefinitely. Advised that this can be discussed at his upcoming visit with Dr. Domenic Polite and that his records would be requested from Dr. Case. Verbalized understanding of plan.

## 2019-11-19 NOTE — Telephone Encounter (Signed)
Patient had a knee replacement two years ago.  Dr Case told him that Xarelto was causing him to have excessive bleeding in his knee and that he needs to come off the medication

## 2019-12-19 ENCOUNTER — Encounter: Payer: Self-pay | Admitting: Cardiology

## 2019-12-19 ENCOUNTER — Encounter (INDEPENDENT_AMBULATORY_CARE_PROVIDER_SITE_OTHER): Payer: Self-pay

## 2019-12-19 ENCOUNTER — Ambulatory Visit: Payer: Medicare Other | Admitting: Cardiology

## 2019-12-19 ENCOUNTER — Other Ambulatory Visit: Payer: Self-pay

## 2019-12-19 VITALS — BP 128/80 | HR 84 | Ht 72.0 in | Wt 165.0 lb

## 2019-12-19 DIAGNOSIS — I480 Paroxysmal atrial fibrillation: Secondary | ICD-10-CM

## 2019-12-19 DIAGNOSIS — I10 Essential (primary) hypertension: Secondary | ICD-10-CM

## 2019-12-19 NOTE — Patient Instructions (Addendum)
Medication Instructions:   Your physician has recommended you make the following change in your medication:  Decrease aspirin 81 mg to daily  Continue other medications the same  Labwork:  NONE  Testing/Procedures:  NONE  Follow-Up:  Your physician recommends that you schedule a follow-up appointment in: 6 months (office). You will receive a reminder letter in the mail in about 4 months reminding you to call and schedule your appointment. If you don't receive this letter, please contact our office.  Any Other Special Instructions Will Be Listed Below (If Applicable).  If you need a refill on your cardiac medications before your next appointment, please call your pharmacy.

## 2019-12-19 NOTE — Progress Notes (Signed)
Cardiology Office Note  Date: 12/19/2019   ID: Robert Gonzalez, DOB July 30, 1941, MRN [id number]  PCP:  Sandi Mealy, MD  Cardiologist:  Rozann Lesches, MD Electrophysiologist:  None   Chief Complaint  Patient presents with  . Cardiac follow-up    History of Present Illness: Robert Gonzalez is a 79 y.o. male last seen in July 2020.  He is here today with his wife for a follow-up visit.  I reviewed interval records.  He has been evaluated by Dr. Case recently with history of right knee hemarthrosis.  He was taken off Xarelto by Dr. Case at time of arthrocentesis and started on aspirin.  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  This is apparently at least the second time that he has had difficulty with this.  No obvious recent joint injury.  CHA2DS2-VASc score is 3.  I talked with him today about the relative risk of stroke on aspirin versus anticoagulant (approximately 3.5 %/year versus 1.5 %/year respectively).  I would not necessarily anticipate any less bleeding risk on Coumadin or Eliquis instead.  He states that he has already been thinking about this and prefers to stay on aspirin daily at this time.  I personally reviewed his ECG today which shows atrial fibrillation with significant lead motion artifact and low voltage.  He does not report any palpitations and is on Toprol-XL for heart rate control.  Past Medical History:  Diagnosis Date  . Arthritis   . COPD (chronic obstructive pulmonary disease) ([place])   . Essential hypertension   . Paroxysmal atrial fibrillation [facility])    Diagnosed April 2017  . Prostate cancer ([place])   . Sleep apnea     Past Surgical History:  Procedure Laterality Date  . COLONOSCOPY    . FRACTURE SURGERY     Right arm  . HERNIA REPAIR Bilateral   . POLYPECTOMY    . PROSTATECTOMY      Current Outpatient Medications  Medication Sig Dispense Refill  . aspirin  EC 81 MG tablet Take 81 mg by mouth 2 (two) times daily.     . Biotin 1000 MCG tablet Take 1,000 mcg by mouth daily.    Marland Kitchen lisinopril (PRINIVIL,ZESTRIL) 5 MG tablet Take 5 mg by mouth daily. GETS REFILLS FROM [facility]    . metoprolol succinate (TOPROL-XL) 50 MG 24 hr tablet TAKE 1 TABLET BY MOUTH  DAILY 90 tablet 3  . Polyethylene Glycol 3350 (MIRALAX PO) Take by mouth every other day.     No current facility-administered medications for this visit.   Allergies:  Patient has no known allergies.   Social History: The patient  reports that he quit smoking about 16 years ago. His smoking use included cigarettes. He has never used smokeless tobacco. He reports that he does not drink alcohol or use drugs.   ROS:  Please see the history of present illness. Otherwise, complete review of systems is positive for none.  All other systems are reviewed and negative.   Physical Exam: VS:  BP 128/80   Pulse 84   Ht 6' (1.829 m)   Wt 165 lb (74.8 kg)   SpO2 98%   BMI 22.38 kg/m , BMI Body mass index is 22.38 kg/m.  Wt Readings from Last 3 Encounters:  12/19/19 165 lb (74.8 kg)  06/03/19 162 lb 3.2 oz (73.6 kg)  08/28/18 170 lb (77.1 kg)    General: Elderly male,  appears comfortable at rest. HEENT: Conjunctiva and lids normal, wearing a mask. Neck: Supple, no elevated JVP or carotid bruits, no thyromegaly. Lungs: Clear to auscultation, nonlabored breathing at rest. Cardiac: Irregularly irregular, no S3, soft systolic murmur. Abdomen: Soft, bowel sounds present. Extremities: No pitting edema, distal pulses 2+. Skin: Warm and dry. Musculoskeletal: No kyphosis. Neuropsychiatric: Alert and oriented x3, affect grossly appropriate.  ECG:  An ECG dated 08/28/2018 was personally reviewed today and demonstrated:  Sinus bradycardia.  Recent Labwork:  December 2019: Hemoglobin 14.5, platelets 244, BUN 18, creatinine 0.97, potassium 4.7, AST 14, ALT 11, cholesterol 194, triglycerides 113, HDL 40, LDL  131  Other Studies Reviewed Today:  Echocardiogram 03/11/2016 [facility]): LVEF 60-65%, no significant valvular abnormalities.  Lexiscan Myoview 03/29/2016 [facility]): No diagnostic ST segment changes or perfusion abnormalities to indicate ischemia with LVEF 60%.  Assessment and Plan:  1.  Paroxysmal atrial fibrillation with CHA2DS2-VASc score of 3.  As noted above he has had recurrent right knee hemarthrosis and has been off Xarelto since December at time of arthrocentesis.  After discussion plan is to continue aspirin 81 mg daily.  We have discussed risk of stroke.  Otherwise continue Toprol-XL for heart rate control.  2.  Essential hypertension, blood pressure control is adequate today.  He is also on lisinopril.  Medication Adjustments/Labs and Tests Ordered: Current medicines are reviewed at length with the patient today.  Concerns regarding medicines are outlined above.   Tests Ordered: Orders Placed This Encounter  Procedures  . EKG 12-Lead    Medication Changes: No orders of the defined types were placed in this encounter.   Disposition:  Follow up 6 months in the [place] office.  Signed, Robert Sark, MD, [facility] 12/19/2019 4:04 PM    [place] at [place], [place],  [address] Phone: [phone]; Fax: [phone]

## 2020-07-08 NOTE — Progress Notes (Signed)
Cardiology Office Note  Date: 07/09/2020   ID: Robert Gonzalez, DOB Nov 20, 1940, MRN [id number]  PCP:  Leeanne Rio, MD  Cardiologist:  Rozann Lesches, MD Electrophysiologist:  None   Chief Complaint  Patient presents with  . Cardiac follow-up    History of Present Illness: Robert Gonzalez is a 79 y.o. male last seen in February.  He presents for a routine visit.  States that he has been doing relatively well, no sense of palpitations and no chest pain.  I went over his medications which are outlined below.  He remains on aspirin, Toprol-XL, and lisinopril.  He was taken off DOAC previously as discussed in the prior note.  I reviewed his interval lab work from April as outlined below.  Past Medical History:  Diagnosis Date  . Arthritis   . COPD (chronic obstructive pulmonary disease) ([place])   . Essential hypertension   . Paroxysmal atrial fibrillation [facility])    Diagnosed April 2017  . Prostate cancer ([place])   . Sleep apnea     Past Surgical History:  Procedure Laterality Date  . COLONOSCOPY    . FRACTURE SURGERY     Right arm  . HERNIA REPAIR Bilateral   . POLYPECTOMY    . PROSTATECTOMY      Current Outpatient Medications  Medication Sig Dispense Refill  . aspirin EC 81 MG tablet Take 81 mg by mouth daily.    . Biotin 1000 MCG tablet Take 1,000 mcg by mouth daily.    Marland Kitchen lisinopril (PRINIVIL,ZESTRIL) 5 MG tablet Take 5 mg by mouth daily. GETS REFILLS FROM [facility]    . metoprolol succinate (TOPROL-XL) 50 MG 24 hr tablet TAKE 1 TABLET BY MOUTH  DAILY 90 tablet 3  . Polyethylene Glycol 3350 (MIRALAX PO) Take by mouth every other day.     No current facility-administered medications for this visit.   Allergies:  Patient has no known allergies.   ROS:  Hearing loss.  Physical Exam: VS:  BP 120/80   Pulse 92   Ht 6' (1.829 m)   Wt 163 lb 3.2 oz (74 kg)   SpO2 98%   BMI 22.13 kg/m , BMI Body mass index is 22.13 kg/m.  Wt Readings from Last 3  Encounters:  07/09/20 163 lb 3.2 oz (74 kg)  12/19/19 165 lb (74.8 kg)  06/03/19 162 lb 3.2 oz (73.6 kg)    General: Elderly male, appears comfortable at rest. HEENT: Conjunctiva and lids normal, wearing a mask. Neck: Supple, no elevated JVP or carotid bruits, no thyromegaly. Lungs: Clear to auscultation, nonlabored breathing at rest. Cardiac: Irregularly irregular, no S3, soft systolic murmur. Extremities: No pitting edema, distal pulses 2+.  ECG:  An ECG dated 12/19/2019 was personally reviewed today and demonstrated:  Atrial fibrillation with significant lead motion artifact and low voltage.  Recent Labwork:  April 2021: Hemoglobin 15.3, platelets 241, potassium 4.9, BUN 17, creatinine 1.02, AST 20, ALT 15 hemoglobin A1c 6.2%, cholesterol 186, triglycerides 128, HDL 36, LDL 127  Other Studies Reviewed Today:  Echocardiogram 03/11/2016 [facility]): LVEF 60-65%, no significant valvular abnormalities.  Lexiscan Myoview 03/29/2016 [facility]): No diagnostic ST segment changes or perfusion abnormalities to indicate ischemia with LVEF 60%.  Assessment and Plan:  1.  Paroxysmal atrial fibrillation, CHA2DS2-VASc score is 3.  He was taken off Xarelto in the setting of recurrent right knee hemarthrosis and has preferred to stay on aspirin.  We have discussed overall stroke risk, he is asymptomatic in terms of  his atrial fibrillation with rate control on Toprol-XL.  2.  Essential hypertension, blood pressure is well controlled today.  Continue lisinopril with follow-up by Dr. Huel Cote.  Medication Adjustments/Labs and Tests Ordered: Current medicines are reviewed at length with the patient today.  Concerns regarding medicines are outlined above.   Tests Ordered: No orders of the defined types were placed in this encounter.   Medication Changes: No orders of the defined types were placed in this encounter.   Disposition:  Follow up 6 months in the [place] office.  Signed, Satira Sark, MD, [facility] 07/09/2020 8:24 AM    [place] at [place], [place], [place] [address] Phone: [phone]; Fax: [phone]

## 2020-07-09 ENCOUNTER — Ambulatory Visit: Payer: Medicare Other | Admitting: Cardiology

## 2020-07-09 ENCOUNTER — Encounter: Payer: Self-pay | Admitting: Cardiology

## 2020-07-09 ENCOUNTER — Other Ambulatory Visit: Payer: Self-pay

## 2020-07-09 VITALS — BP 120/80 | HR 92 | Ht 72.0 in | Wt 163.2 lb

## 2020-07-09 DIAGNOSIS — I10 Essential (primary) hypertension: Secondary | ICD-10-CM

## 2020-07-09 DIAGNOSIS — I480 Paroxysmal atrial fibrillation: Secondary | ICD-10-CM | POA: Diagnosis not present

## 2020-07-09 NOTE — Patient Instructions (Addendum)

## 2020-08-03 ENCOUNTER — Other Ambulatory Visit: Payer: Self-pay | Admitting: Cardiology

## 2021-01-12 ENCOUNTER — Ambulatory Visit: Payer: Medicare Other | Admitting: Cardiology

## 2021-01-12 ENCOUNTER — Encounter: Payer: Self-pay | Admitting: Cardiology

## 2021-01-12 VITALS — BP 112/72 | HR 76 | Ht 75.0 in | Wt 165.0 lb

## 2021-01-12 DIAGNOSIS — I10 Essential (primary) hypertension: Secondary | ICD-10-CM

## 2021-01-12 DIAGNOSIS — I480 Paroxysmal atrial fibrillation: Secondary | ICD-10-CM

## 2021-01-12 NOTE — Patient Instructions (Addendum)

## 2021-01-12 NOTE — Progress Notes (Signed)
Cardiology Office Note  Date: 01/12/2021   ID: Robert Gonzalez, DOB 03-09-1941, MRN [id number]  PCP:  Leeanne Rio, MD  Cardiologist:  Rozann Lesches, MD Electrophysiologist:  None   Chief Complaint  Patient presents with  . Cardiac follow-up    History of Present Illness: Robert Gonzalez is an 80 y.o. male last seen in August 2021.  He presents for a routine visit.  Reports no palpitations or chest pain on current regimen.  States that he had COVID-19 pneumonia back in September 2021.  He has recuperated.  Continues to walk regularly for exercise, usually about 5 blocks each day.  He was previously taken off Xarelto in the setting of recurrent right knee hemarthrosis.  He is on low-dose aspirin along with Toprol-XL.  I personally reviewed his ECG today which shows rate controlled atrial fibrillation in the 70s.   Past Medical History:  Diagnosis Date  . Arthritis   . COPD (chronic obstructive pulmonary disease) ([place])   . Essential hypertension   . Paroxysmal atrial fibrillation [facility])    Diagnosed April 2017  . Prostate cancer ([place])   . Sleep apnea     Past Surgical History:  Procedure Laterality Date  . COLONOSCOPY    . FRACTURE SURGERY     Right arm  . HERNIA REPAIR Bilateral   . POLYPECTOMY    . PROSTATECTOMY      Current Outpatient Medications  Medication Sig Dispense Refill  . aspirin EC 81 MG tablet Take 81 mg by mouth daily.    . Biotin 1000 MCG tablet Take 1,000 mcg by mouth daily.    Marland Kitchen lisinopril (PRINIVIL,ZESTRIL) 5 MG tablet Take 5 mg by mouth daily. GETS REFILLS FROM [facility]    . metoprolol succinate (TOPROL-XL) 50 MG 24 hr tablet TAKE 1 TABLET BY MOUTH  DAILY 90 tablet 3  . Polyethylene Glycol 3350 (MIRALAX PO) Take by mouth every other day.     No current facility-administered medications for this visit.   Allergies:  Patient has no known allergies.   ROS: No dizziness or syncope.  Physical Exam: VS:  BP 112/72   Pulse 76   Ht  6\' 3"  (1.905 m)   Wt 165 lb (74.8 kg)   SpO2 96%   BMI 20.62 kg/m , BMI Body mass index is 20.62 kg/m.  Wt Readings from Last 3 Encounters:  01/12/21 165 lb (74.8 kg)  07/09/20 163 lb 3.2 oz (74 kg)  12/19/19 165 lb (74.8 kg)    General: Elderly male, appears comfortable at rest. HEENT: Conjunctiva and lids normal, wearing a mask. Neck: Supple, no elevated JVP or carotid bruits, no thyromegaly. Lungs: Clear to auscultation, nonlabored breathing at rest. Cardiac: Irregularly irregular, no S3, soft systolic murmur, no pericardial rub. Extremities: No pitting edema.  ECG:  An ECG dated 12/19/2019 was personally reviewed today and demonstrated:  Atrial fibrillation with lead motion artifact and nonspecific T wave changes.  Recent Labwork:  April 2021: Cholesterol 186, triglycerides 128, HDL 36, LDL 127 October 2021: Hemoglobin 13.6, platelets 403, potassium 4.3, BUN 17, creatinine 1.03, AST 21, ALT 21  Other Studies Reviewed Today:  Echocardiogram 03/11/2016 [facility]): LVEF 60-65%, no significant valvular abnormalities.  Lexiscan Myoview 03/29/2016 [facility]): No diagnostic ST segment changes or perfusion abnormalities to indicate ischemia with LVEF 60%.  Assessment and Plan:  1.  Paroxysmal atrial fibrillation with CHA2DS2-VASc score of 3.  He is no longer on Xarelto given recurrent right knee hemarthrosis.  At this  time he is on aspirin along with Toprol-XL, good heart rate control and asymptomatic in terms of palpitations.  2.  Essential hypertension, blood pressure well controlled today.  He is also on lisinopril.  No changes were made.  Medication Adjustments/Labs and Tests Ordered: Current medicines are reviewed at length with the patient today.  Concerns regarding medicines are outlined above.   Tests Ordered: Orders Placed This Encounter  Procedures  . EKG 12-Lead    Medication Changes: No orders of the defined types were placed in this encounter.   Disposition:   Follow up 6 months in the [place] office.  Signed, Satira Sark, MD, [facility] 01/12/2021 3:45 PM    [place] at [place], [place],  [address] Phone: [phone]; Fax: [phone]

## 2021-07-08 ENCOUNTER — Other Ambulatory Visit: Payer: Self-pay | Admitting: Cardiology

## 2021-07-20 ENCOUNTER — Encounter: Payer: Self-pay | Admitting: *Deleted

## 2021-07-21 ENCOUNTER — Other Ambulatory Visit: Payer: Self-pay | Admitting: *Deleted

## 2021-07-21 ENCOUNTER — Encounter: Payer: Self-pay | Admitting: Vascular Surgery

## 2021-07-21 ENCOUNTER — Ambulatory Visit: Payer: Medicare Other | Admitting: Cardiology

## 2021-07-21 ENCOUNTER — Ambulatory Visit: Payer: Medicare Other | Admitting: Vascular Surgery

## 2021-07-21 ENCOUNTER — Encounter: Payer: Self-pay | Admitting: *Deleted

## 2021-07-21 ENCOUNTER — Encounter: Payer: Self-pay | Admitting: Cardiology

## 2021-07-21 ENCOUNTER — Other Ambulatory Visit: Payer: Self-pay

## 2021-07-21 VITALS — BP 110/68 | HR 72 | Ht 72.0 in | Wt 157.4 lb

## 2021-07-21 VITALS — BP 139/81 | HR 71 | Temp 98.2°F | Ht 72.0 in | Wt 156.6 lb

## 2021-07-21 DIAGNOSIS — Z79899 Other long term (current) drug therapy: Secondary | ICD-10-CM

## 2021-07-21 DIAGNOSIS — I480 Paroxysmal atrial fibrillation: Secondary | ICD-10-CM

## 2021-07-21 DIAGNOSIS — I6523 Occlusion and stenosis of bilateral carotid arteries: Secondary | ICD-10-CM | POA: Diagnosis not present

## 2021-07-21 DIAGNOSIS — I10 Essential (primary) hypertension: Secondary | ICD-10-CM | POA: Diagnosis not present

## 2021-07-21 DIAGNOSIS — I712 Thoracic aortic aneurysm, without rupture: Secondary | ICD-10-CM | POA: Diagnosis not present

## 2021-07-21 DIAGNOSIS — I7121 Aneurysm of the ascending aorta, without rupture: Secondary | ICD-10-CM

## 2021-07-21 MED ORDER — APIXABAN 5 MG PO TABS: 5.0000 mg | ORAL_TABLET | Freq: Two times a day (BID) | ORAL | 3 refills | Status: DC

## 2021-07-21 MED ORDER — APIXABAN 5 MG PO TABS: 5.0000 mg | ORAL_TABLET | Freq: Two times a day (BID) | ORAL | 1 refills | Status: DC

## 2021-07-21 NOTE — Patient Instructions (Addendum)
Medication Instructions:  Your physician has recommended you make the following change in your medication:  Stop aspirin Stop plavix Start eliquis 5 mg by mouth twice daily Continue other medications the same  Labwork: BMET & CBC in 3 months just before your next visit at [facility],  Testing/Procedures: none  Follow-Up: Your physician recommends that you schedule a follow-up appointment in: 3 months  Any Other Special Instructions Will Be Listed Below (If Applicable).  If you need a refill on your cardiac medications before your next appointment, please call your pharmacy.

## 2021-07-21 NOTE — Progress Notes (Signed)
V[facility] and Vein Specialist of [place]  Patient name: Robert Gonzalez MRN: [id number] DOB: 1941-05-10 Sex: male  REASON FOR CONSULT: Evaluation bilateral carotid stenosis  HPI: Robert Gonzalez is a 80 y.o. male, who is here today for evaluation.  He is here today with his wife.  He had a event in [place] July of this year with sudden onset of feeling as though he would blackout.  He reports that he was drinking coffee with his wife and had sudden situation where he was unable to communicate.  He did go down to the floor and had some difficulty arising.  He had no focal deficits.  His wife reports that he had equal smile and no tongue deviation.  He reports that subjectively he had sensation that his right side was weak but both the wife and EMS could not detect this.  He reports that over the last year he has had more difficulty with agitation and unsteady gait.  He has had no focal deficits.  He has no history of coronary artery disease.  Does have a history of atrial fibrillation.  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  Past Medical History:  Diagnosis Date   Arthritis    COPD (chronic obstructive pulmonary disease) ([facility])    Essential hypertension    Paroxysmal atrial fibrillation [facility])    Diagnosed April 2017   Prostate cancer [facility])    Sleep apnea     Family History  Problem Relation Age of Onset   Colon cancer Neg Hx    Esophageal cancer Neg Hx    Stomach cancer Neg Hx    Rectal cancer Neg Hx    Colon polyps Neg Hx     SOCIAL HISTORY: Social History   Socioeconomic History   Marital status: Married    Spouse name: Not on file   Number of children: Not on file   Years of education: Not on file   Highest education level: Not on file  Occupational History   Not on file  Tobacco Use   Smoking status:  Former    Types: Cigarettes    Quit date: 11/15/2003    Years since quitting: 17.6   Smokeless tobacco: Never  Vaping Use   Vaping Use: Never used  Substance and Sexual Activity   Alcohol use: No    Alcohol/week: 0.0 standard drinks   Drug use: No   Sexual activity: Not on file  Other Topics Concern   Not on file  Social History Narrative   Not on file   Social Determinants of Health   Financial Resource Strain: Not on file  Food Insecurity: Not on file  Transportation Needs: Not on file  Physical Activity: Not on file  Stress: Not on file  Social Connections: Not on file  Intimate Partner Violence: Not on file    No Known Allergies  Current Outpatient Medications  Medication Sig Dispense Refill   aspirin EC 81 MG tablet Take 81 mg by mouth daily.     Biotin 1000 MCG tablet Take 1,000 mcg by mouth daily.     clopidogrel (PLAVIX) 75 MG tablet Take 75 mg by mouth daily.     lisinopril (PRINIVIL,ZESTRIL) 5 MG tablet Take 5 mg by mouth daily. GETS REFILLS FROM [facility]     metoprolol succinate (TOPROL-XL) 50 MG 24 hr tablet TAKE 1  TABLET BY MOUTH  DAILY 90 tablet 3   Polyethylene Glycol 3350 (MIRALAX PO) Take by mouth every other day.     No current facility-administered medications for this visit.    REVIEW OF SYSTEMS:  '[X]'$  denotes positive finding, '[ ]'$  denotes negative finding Cardiac  Comments:  Chest pain or chest pressure:    Shortness of breath upon exertion:    Short of breath when lying flat:    Irregular heart rhythm:        Vascular    Pain in calf, thigh, or hip brought on by ambulation:    Pain in feet at night that wakes you up from your sleep:     Blood clot in your veins:    Leg swelling:         Pulmonary    Oxygen at home:    Productive cough:     Wheezing:         Neurologic    Sudden weakness in arms or legs:     Sudden numbness in arms or legs:     Sudden onset of difficulty speaking or slurred speech:    Temporary loss of vision in one  eye:     Problems with dizziness:  x       Gastrointestinal    Blood in stool:     Vomited blood:         Genitourinary    Burning when urinating:     Blood in urine:        Psychiatric    Major depression:         Hematologic    Bleeding problems:    Problems with blood clotting too easily:        Skin    Rashes or ulcers:        Constitutional    Fever or chills:      PHYSICAL EXAM: Vitals:   07/21/21 1143 07/21/21 1146  BP: 127/85 139/81  Pulse: 76 71  Temp: 98.2 F (36.8 C)   TempSrc: Temporal   SpO2: 98% 97%  Weight: 156 lb 9.6 oz (71 kg)   Height: 6' (1.829 m)     GENERAL: The patient is a well-nourished male, in no acute distress. The vital signs are documented above. CARDIOVASCULAR: Carotid arteries are without bruits bilaterally.  2+ radial pulses bilaterally. PULMONARY: There is good air exchange  MUSCULOSKELETAL: There are no major deformities or cyanosis. NEUROLOGIC: No focal weakness or paresthesias are detected. SKIN: There are no ulcers or rashes noted. PSYCHIATRIC: The patient has a normal affect.  DATA:  I reviewed his duplex and reviewed actual CT scan images and discussed these with the patient.  He does have extensive calcification of his bifurcation bilaterally but has moderate 50 to 60% internal carotid artery stenoses bilaterally  MEDICAL ISSUES: Moderate bilateral carotid stenosis with no history of focal deficits.  MRI scans during the acute event showed no evidence of acute stroke.  I have recommended that we see him again in 1 year with repeat carotid duplex to rule out any progression but would not recommend treatment unless he developed focal deficit.  He does have an upcoming appointment with [facility] for further evaluation   Rosetta Posner, MD [facility] [phone] Pager [phone]  Note: Portions of this report may have been transcribed using voice  recognition software.  Every effort has been made to ensure accuracy; however, inadvertent computerized  transcription errors may still be present.

## 2021-07-21 NOTE — Progress Notes (Signed)
Cardiology Office Note  Date: 07/21/2021   ID: Robert Gonzalez, DOB 01-22-1941, MRN [id number]  PCP:  Robert Rio, MD  Cardiologist:  Rozann Lesches, MD Electrophysiologist:  None   Chief Complaint  Patient presents with   Cardiac follow-up    History of Present Illness: Robert Gonzalez is an 80 y.o. male last seen in March.  He is here today with his wife.  I reviewed recent records, he has undergone evaluation for intermittent dizziness and potential TIA symptoms, formal neurology consultation pending.  No clear evidence of stroke by neuroimaging and otherwise nonobstructive ICA stenoses with medical management and follow-up planned by Dr. Donnetta Hutching.  He was reportedly also given a 3-day Zio patch, we are trying to obtain the results.  He has a history of paroxysmal to persistent atrial fibrillation, had been on Xarelto years ago but this was discontinued due to recurrent right knee hemarthrosis.  Most recently he was placed on aspirin and Plavix.  Recently he has been in atrial fibrillation by ECG, rate controlled on Toprol-XL.  CHA2DS2-VASc score is 3-5.  I did talk with him about considering a switch from aspirin and Plavix to Eliquis to see if he tolerates this better than he did Xarelto in the past.  Frankly, current antiplatelet regimen would not significantly reduce his stroke risk from the perspective of atrial fibrillation.  Past Medical History:  Diagnosis Date   Arthritis    COPD (chronic obstructive pulmonary disease) ([place])    Essential hypertension    Paroxysmal atrial fibrillation [facility])    Diagnosed April 2017   Prostate cancer [facility])    Sleep apnea     Past Surgical History:  Procedure Laterality Date   COLONOSCOPY     FRACTURE SURGERY     Right arm   HERNIA REPAIR Bilateral    POLYPECTOMY     PROSTATECTOMY      Current Outpatient Medications  Medication Sig Dispense Refill   apixaban (ELIQUIS) 5 MG TABS tablet Take 1 tablet (5 mg total) by  mouth 2 (two) times daily. 60 tablet 3   Biotin 1000 MCG tablet Take 1,000 mcg by mouth daily.     lisinopril (PRINIVIL,ZESTRIL) 5 MG tablet Take 5 mg by mouth daily. GETS REFILLS FROM [facility]     metoprolol succinate (TOPROL-XL) 50 MG 24 hr tablet TAKE 1 TABLET BY MOUTH  DAILY 90 tablet 3   Polyethylene Glycol 3350 (MIRALAX PO) Take by mouth every other day.     No current facility-administered medications for this visit.   Allergies:  Patient has no known allergies.   Social History: The patient  reports that he quit smoking about 17 years ago. His smoking use included cigarettes. He has never used smokeless tobacco. He reports that he does not drink alcohol and does not use drugs.   Family History: The patient's family history is not on file.   ROS: No frank syncope.  No chest pain.  Physical Exam: VS:  BP 110/68   Pulse 72   Ht 6' (1.829 m)   Wt 157 lb 6.4 oz (71.4 kg)   SpO2 97%   BMI 21.35 kg/m , BMI Body mass index is 21.35 kg/m.  Wt Readings from Last 3 Encounters:  07/21/21 157 lb 6.4 oz (71.4 kg)  07/21/21 156 lb 9.6 oz (71 kg)  01/12/21 165 lb (74.8 kg)    General: Elderly male, appears comfortable at rest. HEENT: Conjunctiva and lids normal, wearing a mask. Neck: Supple,  no elevated JVP or carotid bruits, no thyromegaly. Lungs: Clear to auscultation, nonlabored breathing at rest. Cardiac: Irregularly irregular, no S3, 1/6 systolic murmur, no pericardial rub. Extremities: No pitting edema.  ECG:  An ECG dated 07/02/2021 was personally reviewed today and demonstrated:  Atrial fibrillation with controlled ventricular rate.  Recent Labwork:  April 2022: Hemoglobin A1c 6.2%, cholesterol 185, triglycerides 69, HDL 44, LDL 128 July 2022: High-sensitivity troponin I 5, 6, and 18 June 2021: Potassium 4.1, BUN 16, creatinine 1.02, AST 16, ALT 17, hemoglobin 14.6, platelets 184, TSH 4.9  Other Studies Reviewed Today:  Lexiscan Myoview 03/29/2016 [facility]): No  diagnostic ST segment changes or perfusion abnormalities to indicate ischemia with LVEF 60%.  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   Right carotid system: Patent. Primarily calcified plaque along the  proximal internal carotid causing less than 50% stenosis.   Left carotid system: Patent. Mixed plaque along the proximal  internal carotid causing less than 50% stenosis.   Vertebral arteries: Patent. Right vertebral artery is dominant. No  stenosis.   Skeleton: Degenerative changes of the included spine.   Other neck: Unremarkable.   Upper chest: Emphysema.  Scarring at the lung apices.   IMPRESSION:  Plaque along the proximal internal carotid causes less than 50%  stenosis.   Mild aneurysmal dilatation of the ascending thoracic aorta. Annual  imaging follow-up by CTA or MRA is recommended.   Emphysema.   Brain MRI 07/02/2021: IMPRESSION:  Motion degraded. No acute infarction or other acute intracranial  abnormality. No substantial change since prior study.   Echocardiogram 07/02/2021: Summary    1. The left ventricle is normal in size with normal wall thickness.    2. The left ventricular systolic function is normal, LVEF is visually  estimated at 60-65%.    3. There is mild to moderate mitral valve regurgitation.    4. There is mild aortic regurgitation.    5. The left atrium is moderately to severely dilated in size.    6. The right ventricle is normal in size, with normal systolic function.    7. There is mild pulmonary hypertension.    8. The right atrium is moderately dilated  in size.    9. Agitated saline study is negative.   Assessment and Plan:  1.  Persistent atrial fibrillation with CHA2DS2-VASc score of 3-5.  In light of concerns about potential TIA symptoms, I have recommended that we switch him from aspirin and Plavix to Eliquis 5 mg twice daily.  He has a history of recurrent  right knee hemarthrosis on Xarelto years ago, hopefully will tolerate Eliquis better.  I reviewed his recent lab work and we will obtain a follow-up CBC and BMET in 3 months.  Otherwise we will try to obtain the recent Zio patch results.  2.  Nonobstructive bilateral ICA stenosis, less than 50% by CTA neck in August.  He has established with Dr. Donnetta Hutching.  Continue statin therapy.  3.  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  Directed imaging of the chest is pending, arranged by PCP.  4.  Essential hypertension, currently on lisinopril and Toprol-XL.  Blood pressure well controlled today.  Medication Adjustments/Labs and Tests Ordered: Current medicines are reviewed at length with the patient today.  Concerns regarding medicines are outlined above.   Tests Ordered: Orders Placed This Encounter  Procedures  CBC   Basic metabolic panel     Medication Changes: Meds ordered this encounter  Medications   apixaban (ELIQUIS) 5 MG TABS tablet    Sig: Take 1 tablet (5 mg total) by mouth 2 (two) times daily.    Dispense:  60 tablet    Refill:  3    07/21/2021 NEW-stop aspirin & plavix     Disposition:  Follow up  3 months.  Signed, Satira Sark, MD, [facility] 07/21/2021 2:45 PM    [facility] [address], [place], [place] [address] Phone: [phone]; Fax: [phone]

## 2021-07-22 ENCOUNTER — Encounter: Payer: Self-pay | Admitting: *Deleted

## 2021-07-22 NOTE — Telephone Encounter (Signed)
This encounter was created in error - please disregard.

## 2021-07-28 ENCOUNTER — Telehealth: Payer: Self-pay | Admitting: *Deleted

## 2021-07-28 NOTE — Telephone Encounter (Signed)
-----   Message from Satira Sark, MD sent at 07/22/2021  7:41 PM EDT ----- Results reviewed.  Recent Zio patch shows persistent atrial fibrillation.  No change in current plan and recommendation for anticoagulation.

## 2021-07-28 NOTE — Telephone Encounter (Signed)
Patient informed. 

## 2021-08-11 ENCOUNTER — Ambulatory Visit: Payer: Medicare Other | Admitting: Neurology

## 2021-08-11 ENCOUNTER — Encounter: Payer: Self-pay | Admitting: Neurology

## 2021-08-11 VITALS — BP 129/77 | HR 53 | Ht 72.0 in | Wt 162.0 lb

## 2021-08-11 DIAGNOSIS — R2990 Unspecified symptoms and signs involving the nervous system: Secondary | ICD-10-CM | POA: Diagnosis not present

## 2021-08-11 DIAGNOSIS — R569 Unspecified convulsions: Secondary | ICD-10-CM

## 2021-08-11 NOTE — Patient Instructions (Signed)
Continue your current medications  Will obtain Hemoglobin A1C and Lipid panel today  Will also obtain routine EEG  Follow up in 6 months
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
## 2021-08-12 ENCOUNTER — Telehealth: Payer: Self-pay | Admitting: Neurology

## 2021-08-12 LAB — LIPID PANEL
Chol/HDL Ratio: 4.3 ratio (ref 0.0–5.0)
Cholesterol, Total: 195 mg/dL (ref 100–199)
HDL: 45 mg/dL (ref >39)
LDL Chol Calc (NIH): 135 mg/dL — ABNORMAL HIGH (ref 0–99)
Triglycerides: 83 mg/dL (ref 0–149)
VLDL Cholesterol Cal: 15 mg/dL (ref 5–40)

## 2021-08-12 LAB — HEMOGLOBIN A1C
Est. average glucose Bld gHb Est-mCnc: 131 mg/dL
Hgb A1c MFr Bld: 6.2 % — ABNORMAL HIGH (ref 4.8–5.6)

## 2021-08-12 MED ORDER — ATORVASTATIN CALCIUM 20 MG PO TABS: 20.0000 mg | ORAL_TABLET | Freq: Every day | ORAL | 4 refills | Status: DC

## 2021-08-12 NOTE — Telephone Encounter (Signed)
Spoke with wife, discussed elevated LDL and plan to start him on low-dose statin.  She is comfortable with plans; atorvastatin 20 mg sent to the pharmacy  Alric Ran, MD

## 2021-08-12 NOTE — Progress Notes (Signed)
Spoke with wife, informed her of the elevated LDL and plan to start him on low dose statin. She is comfortable with plans.    Alric Ran, MD

## 2021-08-17 ENCOUNTER — Telehealth: Payer: Self-pay | Admitting: Cardiology

## 2021-08-17 NOTE — Telephone Encounter (Signed)
Patient notified and verbalized understanding. 

## 2021-08-17 NOTE — Telephone Encounter (Signed)
Pt has been prescribed Lipitor 20mg  by his Neurologist and is wanting Dr. Domenic Polite to look over it to confirm that he needs to start taking it.   Please call [phone]

## 2021-08-17 NOTE — Telephone Encounter (Signed)
Fwd to provider for review.

## 2021-09-30 ENCOUNTER — Encounter: Payer: Self-pay | Admitting: Urology

## 2021-09-30 ENCOUNTER — Other Ambulatory Visit: Payer: Self-pay

## 2021-09-30 ENCOUNTER — Ambulatory Visit: Payer: Medicare Other | Admitting: Urology

## 2021-09-30 VITALS — BP 138/94 | HR 76 | Wt 160.0 lb

## 2021-09-30 DIAGNOSIS — Z8546 Personal history of malignant neoplasm of prostate: Secondary | ICD-10-CM | POA: Diagnosis not present

## 2021-09-30 DIAGNOSIS — R351 Nocturia: Secondary | ICD-10-CM

## 2021-09-30 NOTE — Progress Notes (Signed)
Assessment: 1. Nocturia   2. History of prostate cancer     Plan: Possible causes of nocturia discussed with the patient.  I discussed watching his fluid intake in the evening.  I reassured him that no significant abnormalities were noted on his evaluation today. He would like to monitor his symptoms at this time.  I think this is appropriate. Return to office as needed.  Chief Complaint:  Chief Complaint  Patient presents with   Nocturia    History of Present Illness:  Robert Gonzalez is a 80 y.o. year old male who is seen in consultation from Leeanne Rio, MD for evaluation of nocturia and low back pain.  He has a history of prostate cancer and underwent a robotic assisted laparoscopic prostatectomy in 2008 by Dr. Jeffie Pollock.  He was last seen in follow-up in 2016.  He reports a 8-month history of gradually worsening frequency, urgency, and nocturia.  He is having nocturia x3.  He also reports low back pain, primarily in the morning with improvement throughout the day.  No dysuria or gross hematuria.  He voids with a good stream and feels like he empties his bladder completely.  He is not having any incontinence.  No history of UTIs or kidney stones. IPSS = 5 today.  Past Medical History:  Past Medical History:  Diagnosis Date   Arthritis    COPD (chronic obstructive pulmonary disease) ([place])    Essential hypertension    Paroxysmal atrial fibrillation ([place])    Diagnosed April 2017   Prostate cancer [facility])    Sleep apnea     Past Surgical History:  Past Surgical History:  Procedure Laterality Date   COLONOSCOPY     FRACTURE SURGERY     Right arm   HERNIA REPAIR Bilateral    POLYPECTOMY     PROSTATECTOMY      Allergies:  No Known Allergies  Family History:  Family History  Problem Relation Age of Onset   Colon cancer Neg Hx    Esophageal cancer Neg Hx    Stomach cancer Neg Hx    Rectal cancer Neg Hx    Colon polyps Neg Hx     Social History:  Social  History   Tobacco Use   Smoking status: Former    Types: Cigarettes    Quit date: 11/15/2003    Years since quitting: 17.8   Smokeless tobacco: Never  Vaping Use   Vaping Use: Never used  Substance Use Topics   Alcohol use: No    Alcohol/week: 0.0 standard drinks   Drug use: No    Review of symptoms:  Constitutional:  Negative for unexplained weight loss, night sweats, fever, chills ENT:  Negative for nose bleeds, sinus pain, painful swallowing CV:  Negative for chest pain, shortness of breath, exercise intolerance, palpitations, loss of consciousness Resp:  Negative for cough, wheezing, shortness of breath GI:  Negative for nausea, vomiting, diarrhea, bloody stools GU:  Positives noted in HPI; otherwise negative for gross hematuria, dysuria, urinary incontinence Neuro:  Negative for seizures, poor balance, limb weakness, slurred speech Psych:  Negative for lack of energy, depression, anxiety Endocrine:  Negative for polydipsia, polyuria, symptoms of hypoglycemia (dizziness, hunger, sweating) Hematologic:  Negative for anemia, purpura, petechia, prolonged or excessive bleeding, use of anticoagulants  Allergic:  Negative for difficulty breathing or choking as a result of exposure to anything; no shellfish allergy; no allergic response (rash/itch) to materials, foods  Physical exam: BP (!) 138/94  Pulse 76   Wt 160 lb (72.6 kg)   BMI 21.70 kg/m  GENERAL APPEARANCE:  Well appearing, well developed, well nourished, NAD HEENT: Atraumatic, Normocephalic, oropharynx clear. NECK: Supple without lymphadenopathy or thyromegaly. LUNGS: Clear to auscultation bilaterally. HEART: Regular Rate and Rhythm without murmurs, gallops, or rubs. ABDOMEN: Soft, non-tender, No Masses. EXTREMITIES: Moves all extremities well.  Without clubbing, cyanosis, or edema. NEUROLOGIC:  Alert and oriented x 3, normal gait, CN II-XII grossly intact.  MENTAL STATUS:  Appropriate. BACK:  Non-tender to  palpation.  No CVAT SKIN:  Warm, dry and intact.   GU: Penis:  circumcised Meatus: Normal Scrotum: normal, no masses Testis: normal without masses bilateral Epididymis: normal Prostate: absent, no nodules felt in fossa Rectum: Normal tone,  no masses or tenderness   Results: U/A dipstick negative

## 2021-09-30 NOTE — Progress Notes (Signed)
Urological Symptom Review  Patient is experiencing the following symptoms: Frequent urination Get up at night to urinate   Review of Systems  Gastrointestinal (upper)  : Negative for upper GI symptoms  Gastrointestinal (lower) : Negative for lower GI symptoms  Constitutional : Fatigue  Skin: Negative for skin symptoms  Eyes: Negative for eye symptoms  Ear/Nose/Throat : Negative for Ear/Nose/Throat symptoms  Hematologic/Lymphatic: Negative for Hematologic/Lymphatic symptoms  Cardiovascular : Negative for cardiovascular symptoms  Respiratory : Negative for respiratory symptoms  Endocrine: Negative for endocrine symptoms  Musculoskeletal: Back pain  Neurological: Negative for neurological symptoms  Psychologic: Negative for psychiatric symptoms

## 2021-10-01 LAB — URINALYSIS, ROUTINE W REFLEX MICROSCOPIC
Bilirubin, UA: NEGATIVE
Glucose, UA: NEGATIVE
Ketones, UA: NEGATIVE
Leukocytes,UA: NEGATIVE
Nitrite, UA: NEGATIVE
Protein,UA: NEGATIVE
RBC, UA: NEGATIVE
Specific Gravity, UA: 1.02 (ref 1.005–1.030)
Urobilinogen, Ur: 1 mg/dL (ref 0.2–1.0)
pH, UA: 6 (ref 5.0–7.5)

## 2021-10-12 NOTE — Progress Notes (Signed)
Cardiology Office Note    Date:  10/20/2021   ID:  ISSAAC Gonzalez, DOB May 09, 1941, MRN [id number]   PCP:  Leeanne Rio, [place]  Cardiologist:  Rozann Lesches, MD   Advanced Practice Provider:  No care team member to display Electrophysiologist:  None   [phone]   Chief Complaint  Patient presents with   Follow-up     History of Present Illness:  Robert Gonzalez is a 80 y.o. male with history of persistent atrial fibrillation.  Saw Dr. Domenic Polite 07/21/2021 who recommended switching him from aspirin and Plavix to Eliquis in light of potential TIA symptoms.  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  Also has nonobstructive bilateral ICA stenosis less than 50% on CTA 06/2021 followed by Dr. 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   Patient comes in for f/u. Denies chest pain, dyspnea, palpitations, dizziness or presyncope. Walks 45-50 min daily.   Past Medical History:  Diagnosis Date   Arthritis    COPD (chronic obstructive pulmonary disease) ([place])    Essential hypertension    Paroxysmal atrial fibrillation [facility])    Diagnosed April 2017   Prostate cancer [facility])    Sleep apnea     Past Surgical History:  Procedure Laterality Date   COLONOSCOPY     FRACTURE SURGERY     Right arm   HERNIA REPAIR Bilateral    POLYPECTOMY     PROSTATECTOMY      Current Medications: Current Meds  Medication Sig   apixaban (ELIQUIS) 5 MG TABS tablet Take 1 tablet (5 mg total) by mouth 2 (two) times daily.   atorvastatin (LIPITOR) 20 MG tablet Take 1 tablet (20 mg total) by mouth daily.   Biotin 1000 MCG tablet Take 1,000 mcg by mouth daily.   lisinopril (PRINIVIL,ZESTRIL) 5 MG tablet Take 5 mg by mouth daily. GETS REFILLS FROM [facility]   metoprolol succinate (TOPROL-XL) 50 MG 24 hr tablet TAKE 1 TABLET BY  MOUTH  DAILY   Polyethylene Glycol 3350 (MIRALAX PO) Take by mouth every other day.     Allergies:   Patient has no known allergies.   Social History   Socioeconomic History   Marital status: Married    Spouse name: Not on file   Number of children: Not on file   Years of education: Not on file   Highest education level: Not on file  Occupational History   Not on file  Tobacco Use   Smoking status: Former    Types: Cigarettes    Quit date: 11/15/2003    Years since quitting: 17.9   Smokeless tobacco: Never  Vaping Use   Vaping Use: Never used  Substance and Sexual Activity   Alcohol use: No    Alcohol/week: 0.0 standard drinks   Drug use: No   Sexual activity: Not on file  Other Topics Concern   Not on file  Social History Narrative   Not on file   Social Determinants of Health   Financial Resource Strain: Not on file  Food Insecurity: Not on file  Transportation Needs: Not on file  Physical Activity: Not on file  Stress: Not on file  Social Connections: Not on file     Family History:  The patient's  family history is not on file.   ROS:   Please  see the history of present illness.    ROS All other systems reviewed and are negative.   PHYSICAL EXAM:   VS:  BP 118/72   Pulse 69   Ht 6' (1.829 m)   Wt 158 lb 9.6 oz (71.9 kg)   SpO2 99%   BMI 21.51 kg/m   Physical Exam  GEN: Thin, in no acute distress  Neck: no JVD, carotid bruits, or masses Cardiac:RRR; no murmurs, rubs, or gallops  Respiratory:  clear to auscultation bilaterally, normal work of breathing GI: soft, nontender, nondistended, + BS Ext: without cyanosis, clubbing, or edema, Good distal pulses bilaterally Neuro:  Alert and Oriented x 3,  Psych: euthymic mood, full affect  Wt Readings from Last 3 Encounters:  10/20/21 158 lb 9.6 oz (71.9 kg)  09/30/21 160 lb (72.6 kg)  08/11/21 162 lb (73.5 kg)      Studies/Labs Reviewed:   EKG:  EKG is not ordered today.    Recent Labs: No  results found for requested labs within last 8760 hours.   Lipid Panel    Component Value Date/Time   CHOL 195 08/11/2021 0914   TRIG 83 08/11/2021 0914   HDL 45 08/11/2021 0914   CHOLHDL 4.3 08/11/2021 0914   LDLCALC 135 (H) 08/11/2021 0914    Additional studies/ records that were reviewed today include:    Lexiscan Myoview 03/29/2016 [facility]): No diagnostic ST segment changes or perfusion abnormalities to indicate ischemia with LVEF 60%.   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   Right carotid system: Patent. Primarily calcified plaque along the  proximal internal carotid causing less than 50% stenosis.   Left carotid system: Patent. Mixed plaque along the proximal  internal carotid causing less than 50% stenosis.   Vertebral arteries: Patent. Right vertebral artery is dominant. No  stenosis.   Skeleton: Degenerative changes of the included spine.   Other neck: Unremarkable.   Upper chest: Emphysema.  Scarring at the lung apices.   IMPRESSION:  Plaque along the proximal internal carotid causes less than 50%  stenosis.   Mild aneurysmal dilatation of the ascending thoracic aorta. Annual  imaging follow-up by CTA or MRA is recommended.   Emphysema.    Brain MRI 07/02/2021: IMPRESSION:  Motion degraded. No acute infarction or other acute intracranial  abnormality. No substantial change since prior study.    Echocardiogram 07/02/2021: Summary    1. The left ventricle is normal in size with normal wall thickness.    2. The left ventricular systolic function is normal, LVEF is visually  estimated at 60-65%.    3. There is mild to moderate mitral valve regurgitation.    4. There is mild aortic regurgitation.    5. The left atrium is moderately to severely dilated in size.    6. The right ventricle is normal in size, with normal systolic function.    7. 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    8. The right atrium is moderately dilated  in size.    9. Agitated saline study is negative.     Risk Assessment/Calculations:    CHA2DS2-VASc Score = 6   This indicates a 9.7% annual risk of stroke. The patient's score is based upon: CHF History: 0 HTN History: 1 Diabetes History: 0 Stroke History: 2 Vascular Disease History: 1 Age Score: 2 Gender Score: 0       ASSESSMENT:    1. Persistent atrial fibrillation ([place])  2. TIA (transient ischemic attack)   3. Essential hypertension   4. Aneurysm of ascending aorta without rupture      PLAN:  In order of problems listed above:  Persistent atrial fibrillation now on Eliquis has a history of recurrent right knee hemarthrosis on Xarelto in the past. No bleeding problems on Eliquis. Labs normal 10/12/21.   Question of TIA symptoms 07/2021 now on Eliquis and lipitor  Hypertension BP controlled  Dilated thoracic aorta 4 cm on CTA   Shared Decision Making/Informed Consent        Medication Adjustments/Labs and Tests Ordered: Current medicines are reviewed at length with the patient today.  Concerns regarding medicines are outlined above.  Medication changes, Labs and Tests ordered today are listed in the Patient Instructions below. Patient Instructions  Medication Instructions:  Continue all current medications.  Labwork: none  Testing/Procedures: none  Follow-Up: Your physician wants you to follow up in: 6 months.  You will receive a reminder letter in the mail one-two months in advance.  If you don't receive a letter, please call our office to schedule the follow up appointment   Any Other Special Instructions Will Be Listed Below (If Applicable).   If you need a refill on your cardiac medications before your next appointment, please call your pharmacy.   Sumner Boast, PA-C  10/20/2021 10:53 AM    [facility] [address], [place], [place]  [address] Phone:  [phone]; Fax: [phone]

## 2021-10-14 ENCOUNTER — Telehealth: Payer: Self-pay | Admitting: *Deleted

## 2021-10-14 NOTE — Telephone Encounter (Signed)
-----   Message from Satira Sark, MD sent at 10/12/2021  7:36 PM EST ----- Results reviewed.  Hemoglobin remains normal at 14.9 and creatinine normal at 1.03.  Continue with current medications and follow-up plan.

## 2021-10-14 NOTE — Telephone Encounter (Signed)
Patient informed. Copy sent to PCP °

## 2021-10-20 ENCOUNTER — Ambulatory Visit: Payer: Medicare Other | Admitting: Physician Assistant

## 2021-10-20 ENCOUNTER — Encounter: Payer: Self-pay | Admitting: Physician Assistant

## 2021-10-20 ENCOUNTER — Ambulatory Visit: Payer: Medicare Other | Admitting: Vascular Surgery

## 2021-10-20 VITALS — BP 118/72 | HR 69 | Ht 72.0 in | Wt 158.6 lb

## 2021-10-20 DIAGNOSIS — I10 Essential (primary) hypertension: Secondary | ICD-10-CM

## 2021-10-20 DIAGNOSIS — I4819 Other persistent atrial fibrillation: Secondary | ICD-10-CM

## 2021-10-20 DIAGNOSIS — G459 Transient cerebral ischemic attack, unspecified: Secondary | ICD-10-CM | POA: Diagnosis not present

## 2021-10-20 DIAGNOSIS — I7121 Aneurysm of the ascending aorta, without rupture: Secondary | ICD-10-CM

## 2021-10-20 DIAGNOSIS — E7800 Pure hypercholesterolemia, unspecified: Secondary | ICD-10-CM | POA: Insufficient documentation

## 2021-10-20 NOTE — Patient Instructions (Signed)

## 2021-12-02 ENCOUNTER — Ambulatory Visit: Payer: Medicare Other | Admitting: Urology

## 2021-12-28 ENCOUNTER — Other Ambulatory Visit: Payer: Self-pay | Admitting: Cardiology

## 2021-12-28 NOTE — Telephone Encounter (Signed)
Prescription refill request for Eliquis received. Indication: PAF Last office visit: 10/20/21  Gerrianne Scale PA-C Scr: 1.03 on 10/12/21 Age: 81 Weight: 71.9kg  Based on above findings Eliquis 5mg  twice daily is the appropriate dose.  Refill approved.

## 2022-02-09 ENCOUNTER — Ambulatory Visit: Payer: Medicare Other | Admitting: Neurology

## 2022-02-14 ENCOUNTER — Telehealth: Payer: Self-pay | Admitting: Cardiology

## 2022-02-14 MED ORDER — ATORVASTATIN CALCIUM 20 MG PO TABS: 20.0000 mg | ORAL_TABLET | Freq: Every day | ORAL | 1 refills | Status: DC

## 2022-02-14 MED ORDER — APIXABAN 5 MG PO TABS: 5.0000 mg | ORAL_TABLET | Freq: Two times a day (BID) | ORAL | 3 refills | Status: DC

## 2022-02-14 NOTE — Telephone Encounter (Signed)
?*  STAT* If patient is at the pharmacy, call can be transferred to refill team. ? ? ?1. Which medications need to be refilled? (please list name of each medication and dose if known)  ?atorvastatin (LIPITOR) 20 MG tablet ?apixaban (ELIQUIS) 5 MG TABS tablet ? ?2. Which pharmacy/location (including street and city if local pharmacy) is medication to be sent to? [facility] ([place], [place] ? ?3. Do they need a 30 day or 90 day supply? 90 day  ?

## 2022-02-14 NOTE — Telephone Encounter (Signed)
Refill request for Liiptior 20 mg tablets sent to [facility] per pt's request. Will fwd call to L. Joneen Caraway, RN for [employer] refill.  ?

## 2022-02-14 NOTE — Telephone Encounter (Signed)
Prescription refill request for Eliquis received. ?Indication: Atrial Fib ?Last office visit: 10/20/21  Gerrianne Scale PA-C ?Scr: 1.03 on 10/12/21 ?Age: 81 ?Weight: 71.9kg ? ?Based on above findings Eliquis '5mg'$  daily is the appropriate dose.  Refill approved. ? ?

## 2022-05-10 ENCOUNTER — Encounter: Payer: Self-pay | Admitting: Cardiology

## 2022-05-10 ENCOUNTER — Ambulatory Visit (INDEPENDENT_AMBULATORY_CARE_PROVIDER_SITE_OTHER): Payer: Medicare Other | Admitting: Cardiology

## 2022-05-10 VITALS — BP 118/82 | HR 84 | Ht 72.0 in | Wt 157.6 lb

## 2022-05-10 DIAGNOSIS — I4819 Other persistent atrial fibrillation: Secondary | ICD-10-CM

## 2022-05-10 DIAGNOSIS — I10 Essential (primary) hypertension: Secondary | ICD-10-CM

## 2022-05-10 DIAGNOSIS — I6523 Occlusion and stenosis of bilateral carotid arteries: Secondary | ICD-10-CM | POA: Diagnosis not present

## 2022-05-16 ENCOUNTER — Telehealth: Payer: Self-pay | Admitting: Cardiology

## 2022-05-16 MED ORDER — ATORVASTATIN CALCIUM 20 MG PO TABS: 20.0000 mg | ORAL_TABLET | Freq: Every day | ORAL | 1 refills | Status: DC

## 2022-05-16 NOTE — Telephone Encounter (Signed)
Refill sent as requested. 

## 2022-05-16 NOTE — Telephone Encounter (Signed)
*  STAT* If patient is at the pharmacy, call can be transferred to refill team.   1. Which medications need to be refilled? (please list name of each medication and dose if known) atorvastatin (LIPITOR) 20 MG tablet  2. Which pharmacy/location (including street and city if local pharmacy) is medication to be sent to? [facility] ([place], [place]  3. Do they need a 30 day or 90 day supply? 90 day

## 2022-06-16 ENCOUNTER — Other Ambulatory Visit: Payer: Self-pay | Admitting: Cardiology

## 2022-11-04 ENCOUNTER — Ambulatory Visit: Payer: Medicare Other | Admitting: Cardiology

## 2022-11-21 ENCOUNTER — Other Ambulatory Visit: Payer: Self-pay | Admitting: Cardiology

## 2022-11-22 DIAGNOSIS — E7800 Pure hypercholesterolemia, unspecified: Secondary | ICD-10-CM | POA: Diagnosis not present

## 2022-11-22 DIAGNOSIS — I10 Essential (primary) hypertension: Secondary | ICD-10-CM | POA: Diagnosis not present

## 2022-12-23 ENCOUNTER — Other Ambulatory Visit: Payer: Self-pay | Admitting: Cardiology

## 2022-12-23 DIAGNOSIS — I4819 Other persistent atrial fibrillation: Secondary | ICD-10-CM

## 2022-12-23 NOTE — Telephone Encounter (Signed)
Prescription refill request for Eliquis received. Indication: Afib  Last office visit: 05/10/22 Domenic Polite)  Scr: 1.06 (05/18/22 via [place])  Age: 82 Weight: 71.5kg  Appropriate dose. Refill sent.

## 2023-01-04 DIAGNOSIS — J029 Acute pharyngitis, unspecified: Secondary | ICD-10-CM | POA: Diagnosis not present

## 2023-01-25 DIAGNOSIS — C44612 Basal cell carcinoma of skin of right upper limb, including shoulder: Secondary | ICD-10-CM | POA: Diagnosis not present

## 2023-01-25 DIAGNOSIS — L821 Other seborrheic keratosis: Secondary | ICD-10-CM | POA: Diagnosis not present

## 2023-01-25 DIAGNOSIS — L820 Inflamed seborrheic keratosis: Secondary | ICD-10-CM | POA: Diagnosis not present

## 2023-01-31 ENCOUNTER — Encounter: Payer: Self-pay | Admitting: Cardiology

## 2023-01-31 ENCOUNTER — Ambulatory Visit: Payer: Medicare Other | Attending: Cardiology | Admitting: Cardiology

## 2023-01-31 VITALS — BP 118/64 | HR 76 | Ht 72.0 in | Wt 157.2 lb

## 2023-01-31 DIAGNOSIS — I4819 Other persistent atrial fibrillation: Secondary | ICD-10-CM | POA: Diagnosis not present

## 2023-01-31 DIAGNOSIS — I10 Essential (primary) hypertension: Secondary | ICD-10-CM

## 2023-01-31 DIAGNOSIS — I6523 Occlusion and stenosis of bilateral carotid arteries: Secondary | ICD-10-CM | POA: Diagnosis not present

## 2023-01-31 NOTE — Patient Instructions (Addendum)
Medication Instructions:  ?Your physician recommends that you continue on your current medications as directed. Please refer to the Current Medication list given to you today. ? ?Labwork: ?none ? ?Testing/Procedures: ?Your physician has requested that you have a carotid duplex. This test is an ultrasound of the carotid arteries in your neck. It looks at blood flow through these arteries that supply the brain with blood. Allow one hour for this exam. There are no restrictions or special instructions.  ? ?Follow-Up: ?Your physician recommends that you schedule a follow-up appointment in: 6 months ? ?Any Other Special Instructions Will Be Listed Below (If Applicable). ? ?If you need a refill on your cardiac medications before your next appointment, please call your pharmacy. ? ?

## 2023-01-31 NOTE — Progress Notes (Signed)
    Cardiology Office Note  Date: 01/31/2023   ID: Robert Gonzalez, DOB 28-Oct-1941, MRN [id number]  History of Present Illness: Robert Gonzalez is an 82 y.o. male last seen in June 2023.  He is here for a routine visit.  He does not report any sense of palpitations, no dizziness or syncope.  Remains functional with ADLs.  I reviewed his medications.  He does not report any spontaneous bleeding problems on Eliquis.  He had lab work in July 2023 at which point his LDL was 57 on Lipitor.  Last carotid Dopplers were at [facility] in 2022, we discussed getting an updated study for surveillance.  Physical Exam: VS:  BP 118/64   Pulse 76   Ht 6' (1.829 m)   Wt 157 lb 3.2 oz (71.3 kg)   SpO2 100%   BMI 21.32 kg/m , BMI Body mass index is 21.32 kg/m.  Wt Readings from Last 3 Encounters:  01/31/23 157 lb 3.2 oz (71.3 kg)  05/10/22 157 lb 9.6 oz (71.5 kg)  10/20/21 158 lb 9.6 oz (71.9 kg)    General: Patient appears comfortable at rest. HEENT: Conjunctiva and lids normal. Neck: Supple, no elevated JVP or carotid bruits. Lungs: Clear to auscultation, nonlabored breathing at rest. Cardiac: RRR without gallop. Extremities: No pitting edema.  ECG:  An ECG dated 07/02/2021 was personally reviewed today and demonstrated:  Atrial fibrillation.  Labwork:  July 2023: Hemoglobin 15.5, platelets 176, BUN 16, creatinine 1.06, potassium 4.3, AST 18, ALT 17, cholesterol 117, triglycerides 66, HDL 46, LDL 57, hemoglobin A1c 6.2%, TSH 2.66  Other Studies Reviewed Today:  No interval cardiac testing for review today.  Assessment and Plan:  1.  Persistent atrial fibrillation with CHA2DS2-VASc score 5.  He is symptomatically stable without significant palpitations on Toprol-XL and remains on Eliquis for stroke prophylaxis.  No reported spontaneous bleeding problems.  I reviewed his lab work from July 2023.  Hemoglobin and renal function normal at that time.  No changes were made  today.  2.  Essential hypertension.  Blood pressure is normal today.  3.  Asymptomatic nonobstructive carotid artery disease based on Dopplers from August 2022 at [facility].  Continue statin therapy.  We will obtain follow-up carotid Dopplers for surveillance.  Disposition:  Follow up  6 months  Signed, Satira Sark, M.D., F.A.C.C.

## 2023-02-02 ENCOUNTER — Other Ambulatory Visit: Payer: Self-pay | Admitting: Cardiology

## 2023-03-08 DIAGNOSIS — Z08 Encounter for follow-up examination after completed treatment for malignant neoplasm: Secondary | ICD-10-CM | POA: Diagnosis not present

## 2023-03-08 DIAGNOSIS — Z85828 Personal history of other malignant neoplasm of skin: Secondary | ICD-10-CM | POA: Diagnosis not present

## 2023-03-22 ENCOUNTER — Ambulatory Visit: Payer: Medicare Other | Attending: Cardiology

## 2023-03-22 DIAGNOSIS — I6523 Occlusion and stenosis of bilateral carotid arteries: Secondary | ICD-10-CM | POA: Diagnosis not present

## 2023-05-23 DIAGNOSIS — K5732 Diverticulitis of large intestine without perforation or abscess without bleeding: Secondary | ICD-10-CM | POA: Diagnosis not present

## 2023-05-23 DIAGNOSIS — R7303 Prediabetes: Secondary | ICD-10-CM | POA: Diagnosis not present

## 2023-05-23 DIAGNOSIS — E7800 Pure hypercholesterolemia, unspecified: Secondary | ICD-10-CM | POA: Diagnosis not present

## 2023-06-13 DIAGNOSIS — K5792 Diverticulitis of intestine, part unspecified, without perforation or abscess without bleeding: Secondary | ICD-10-CM | POA: Diagnosis not present

## 2023-06-13 DIAGNOSIS — R7303 Prediabetes: Secondary | ICD-10-CM | POA: Diagnosis not present

## 2023-06-13 DIAGNOSIS — R1030 Lower abdominal pain, unspecified: Secondary | ICD-10-CM | POA: Diagnosis not present

## 2023-06-13 DIAGNOSIS — R918 Other nonspecific abnormal finding of lung field: Secondary | ICD-10-CM | POA: Diagnosis not present

## 2023-06-13 DIAGNOSIS — K5730 Diverticulosis of large intestine without perforation or abscess without bleeding: Secondary | ICD-10-CM | POA: Diagnosis not present

## 2023-06-30 ENCOUNTER — Other Ambulatory Visit: Payer: Self-pay | Admitting: Cardiology

## 2023-07-04 DIAGNOSIS — R918 Other nonspecific abnormal finding of lung field: Secondary | ICD-10-CM | POA: Diagnosis not present

## 2023-07-04 DIAGNOSIS — J439 Emphysema, unspecified: Secondary | ICD-10-CM | POA: Diagnosis not present

## 2023-07-04 DIAGNOSIS — I7121 Aneurysm of the ascending aorta, without rupture: Secondary | ICD-10-CM | POA: Diagnosis not present

## 2023-08-11 ENCOUNTER — Encounter: Payer: Self-pay | Admitting: Cardiology

## 2023-08-11 ENCOUNTER — Ambulatory Visit: Payer: Medicare Other | Attending: Cardiology | Admitting: Cardiology

## 2023-08-11 VITALS — BP 100/68 | HR 70 | Ht 72.0 in | Wt 154.4 lb

## 2023-08-11 DIAGNOSIS — I6523 Occlusion and stenosis of bilateral carotid arteries: Secondary | ICD-10-CM | POA: Diagnosis not present

## 2023-08-11 DIAGNOSIS — I10 Essential (primary) hypertension: Secondary | ICD-10-CM | POA: Diagnosis not present

## 2023-08-11 DIAGNOSIS — I4819 Other persistent atrial fibrillation: Secondary | ICD-10-CM

## 2023-08-11 NOTE — Progress Notes (Signed)
    Cardiology Office Note  Date: 08/11/2023   ID: Robert Gonzalez, DOB 11-20-1940, MRN [id number]  History of Present Illness: Robert Gonzalez is an 82 y.o. male last seen in March.  He is here for a routine visit.  He does not report any specific change in stamina or increasing sense of palpitations.  He walks for about 40 minutes with his wife daily.  No falls or syncope.  I reviewed his medications.  He continues on Eliquis without reported spontaneous bleeding problems, Lipitor, lisinopril, and Toprol-XL.  Lab work in July revealed LDL 53.  Physical Exam: VS:  BP 100/68 (BP Location: Right Arm)   Pulse 70   Ht 6' (1.829 m)   Wt 154 lb 6.4 oz (70 kg)   SpO2 98%   BMI 20.94 kg/m , BMI Body mass index is 20.94 kg/m.  Wt Readings from Last 3 Encounters:  08/11/23 154 lb 6.4 oz (70 kg)  01/31/23 157 lb 3.2 oz (71.3 kg)  05/10/22 157 lb 9.6 oz (71.5 kg)    General: Patient appears comfortable at rest. HEENT: Conjunctiva and lids normal. Neck: Supple, no elevated JVP or carotid bruits. Lungs: Clear to auscultation, nonlabored breathing at rest. Cardiac: Irregularly irregular, no gallop.  ECG:  An ECG dated 01/31/2023 was personally reviewed today and demonstrated:  Atrial fibrillation.  Labwork:  July 2024: Potassium 4.6, BUN 12, creatinine 1.04, hemoglobin 14.1, platelets 172, hemoglobin A1c 5.8%, cholesterol 119, triglycerides 55, HDL 55, LDL 53  Other Studies Reviewed Today:  Carotid Dopplers 03/22/2023: Summary:  Right Carotid: Velocities in the right ICA are consistent with a 1-39%  stenosis.   Left Carotid: Velocities in the left ICA are consistent with a 1-39%  stenosis.   Vertebrals: Bilateral vertebral arteries demonstrate antegrade flow.  Subclavians: Normal flow hemodynamics were seen in bilateral subclavian               arteries.   Assessment and Plan:  1.  Persistent atrial fibrillation with CHA2DS2-VASc score 5.  Symptomatically stable with no  increasing palpitations.  Continue Toprol-XL and Eliquis as before.   2.  Essential hypertension.  He is on lisinopril, blood pressure low normal today, asymptomatic.   3.  Mild carotid artery disease by follow-up carotid Dopplers in May.  He remains asymptomatic.  Continue Lipitor.  Disposition:  Follow up  6 months.  Signed, Jonelle Sidle, M.D., F.A.C.C. [facility] at [facility]

## 2023-08-11 NOTE — Patient Instructions (Addendum)

## 2023-08-23 DIAGNOSIS — Z23 Encounter for immunization: Secondary | ICD-10-CM | POA: Diagnosis not present

## 2023-08-23 DIAGNOSIS — K5732 Diverticulitis of large intestine without perforation or abscess without bleeding: Secondary | ICD-10-CM | POA: Diagnosis not present

## 2023-08-23 DIAGNOSIS — I480 Paroxysmal atrial fibrillation: Secondary | ICD-10-CM | POA: Diagnosis not present

## 2023-08-23 DIAGNOSIS — I10 Essential (primary) hypertension: Secondary | ICD-10-CM | POA: Diagnosis not present

## 2023-09-30 ENCOUNTER — Other Ambulatory Visit: Payer: Self-pay | Admitting: Cardiology

## 2023-09-30 DIAGNOSIS — I4819 Other persistent atrial fibrillation: Secondary | ICD-10-CM

## 2023-10-02 NOTE — Telephone Encounter (Signed)
Prescription refill request for Eliquis received. Indication: AF Last office visit: 08/11/23  Ival Bible MD Scr: 1.04 on 06/13/23 Age: 82 Weight: 70kg  Based on above findings Eliquis 5mg  twice daily is the appropriate dose.  Refill approved.

## 2023-10-17 ENCOUNTER — Other Ambulatory Visit: Payer: Self-pay | Admitting: Cardiology

## 2023-10-31 DIAGNOSIS — Z96651 Presence of right artificial knee joint: Secondary | ICD-10-CM | POA: Diagnosis not present

## 2023-10-31 DIAGNOSIS — M25561 Pain in right knee: Secondary | ICD-10-CM | POA: Diagnosis not present

## 2023-11-13 DIAGNOSIS — M25561 Pain in right knee: Secondary | ICD-10-CM | POA: Diagnosis not present

## 2023-11-13 DIAGNOSIS — Z96651 Presence of right artificial knee joint: Secondary | ICD-10-CM | POA: Diagnosis not present

## 2023-12-22 DIAGNOSIS — Z0000 Encounter for general adult medical examination without abnormal findings: Secondary | ICD-10-CM | POA: Diagnosis not present

## 2024-02-19 DIAGNOSIS — Z0000 Encounter for general adult medical examination without abnormal findings: Secondary | ICD-10-CM | POA: Diagnosis not present

## 2024-02-19 DIAGNOSIS — I10 Essential (primary) hypertension: Secondary | ICD-10-CM | POA: Diagnosis not present

## 2024-02-19 DIAGNOSIS — R7302 Impaired glucose tolerance (oral): Secondary | ICD-10-CM | POA: Diagnosis not present

## 2024-02-19 DIAGNOSIS — E7800 Pure hypercholesterolemia, unspecified: Secondary | ICD-10-CM | POA: Diagnosis not present

## 2024-02-19 DIAGNOSIS — I480 Paroxysmal atrial fibrillation: Secondary | ICD-10-CM | POA: Diagnosis not present

## 2024-03-07 ENCOUNTER — Encounter: Payer: Self-pay | Admitting: Cardiology

## 2024-03-07 ENCOUNTER — Ambulatory Visit: Payer: Medicare Other | Attending: Cardiology | Admitting: Cardiology

## 2024-03-07 VITALS — BP 132/80 | HR 81 | Ht 72.0 in | Wt 155.8 lb

## 2024-03-07 DIAGNOSIS — I6523 Occlusion and stenosis of bilateral carotid arteries: Secondary | ICD-10-CM | POA: Diagnosis not present

## 2024-03-07 DIAGNOSIS — I4819 Other persistent atrial fibrillation: Secondary | ICD-10-CM

## 2024-03-07 DIAGNOSIS — I10 Essential (primary) hypertension: Secondary | ICD-10-CM | POA: Diagnosis not present

## 2024-03-07 NOTE — Patient Instructions (Addendum)

## 2024-03-07 NOTE — Progress Notes (Signed)
    Cardiology Office Note  Date: 03/07/2024   ID: Robert Gonzalez, DOB 1941/10/13, MRN [id number]  History of Present Illness: Robert Gonzalez is an 83 y.o. male last seen in September 2024.  He is here for a routine visit.  Reports no chest pain or palpitations, no dizziness or syncope.  We went over his medications.  He does not report any spontaneous bleeding problems on Eliquis .  States that he has been compliant with his medications.  I did review his recent lab work obtained at physical per PCP.  I reviewed his ECG today which shows rate controlled atrial fibrillation with nonspecific ST changes.  Physical Exam: VS:  BP 132/80 (BP Location: Left Arm)   Pulse 81   Ht 6' (1.829 m)   Wt 155 lb 12.8 oz (70.7 kg)   SpO2 98%   BMI 21.13 kg/m , BMI Body mass index is 21.13 kg/m.  Wt Readings from Last 3 Encounters:  03/07/24 155 lb 12.8 oz (70.7 kg)  08/11/23 154 lb 6.4 oz (70 kg)  01/31/23 157 lb 3.2 oz (71.3 kg)    General: Patient appears comfortable at rest. HEENT: Conjunctiva and lids normal. Neck: Supple, no elevated JVP or carotid bruits. Lungs: Clear to auscultation, nonlabored breathing at rest. Cardiac: Irregularly irregular, no significant murmur or gallop. Extremities: No pitting edema.  ECG:  An ECG dated 01/31/2023 was personally reviewed today and demonstrated:  Atrial fibrillation.  Labwork:  April 2025: Hemoglobin 14, platelets 162, cholesterol 124, triglycerides 62, HDL 54, LDL 58, TSH 2.94, potassium 4.5, BUN 25, creatinine 1.15, GFR 63, AST 21, ALT 26, magnesium 2.1, hemoglobin A1c 6.1%  Other Studies Reviewed Today:  No interval cardiac testing for review today.  Assessment and Plan:  1.  Persistent atrial fibrillation with CHA2DS2-VASc score 5.  Patient remains asymptomatic in terms of palpitations, is in rate controlled atrial fibrillation by ECG today on Toprol -XL 50 mg daily.  Continue Eliquis  5 mg twice daily for stroke prophylaxis.  He  denies any spontaneous bleeding problems.  I reviewed his recent lab work.   2.  Primary hypertension.  Continue with present regimen including lisinopril 5 mg daily.   3.  Mild carotid artery disease by follow-up carotid Dopplers in May 2004.  Continue Lipitor 20 mg daily.  Recent LDL 58.  Disposition:  Follow up  6 months.  Signed, Gerard Knight, M.D., F.A.C.C. [facility] at [facility]

## 2024-03-25 DIAGNOSIS — H9202 Otalgia, left ear: Secondary | ICD-10-CM | POA: Diagnosis not present

## 2024-03-30 ENCOUNTER — Other Ambulatory Visit: Payer: Self-pay | Admitting: Cardiology

## 2024-04-07 ENCOUNTER — Other Ambulatory Visit: Payer: Self-pay | Admitting: Cardiology

## 2024-04-11 DIAGNOSIS — H6122 Impacted cerumen, left ear: Secondary | ICD-10-CM | POA: Diagnosis not present

## 2024-04-18 DIAGNOSIS — G912 (Idiopathic) normal pressure hydrocephalus: Secondary | ICD-10-CM | POA: Diagnosis not present

## 2024-04-18 DIAGNOSIS — R270 Ataxia, unspecified: Secondary | ICD-10-CM | POA: Diagnosis not present

## 2024-04-18 DIAGNOSIS — Z79899 Other long term (current) drug therapy: Secondary | ICD-10-CM | POA: Diagnosis not present

## 2024-04-18 DIAGNOSIS — I639 Cerebral infarction, unspecified: Secondary | ICD-10-CM | POA: Diagnosis not present

## 2024-04-18 DIAGNOSIS — Z20822 Contact with and (suspected) exposure to covid-19: Secondary | ICD-10-CM | POA: Diagnosis not present

## 2024-04-18 DIAGNOSIS — Z8673 Personal history of transient ischemic attack (TIA), and cerebral infarction without residual deficits: Secondary | ICD-10-CM | POA: Diagnosis not present

## 2024-04-18 DIAGNOSIS — R918 Other nonspecific abnormal finding of lung field: Secondary | ICD-10-CM | POA: Diagnosis not present

## 2024-04-18 DIAGNOSIS — I351 Nonrheumatic aortic (valve) insufficiency: Secondary | ICD-10-CM | POA: Diagnosis not present

## 2024-04-18 DIAGNOSIS — R509 Fever, unspecified: Secondary | ICD-10-CM | POA: Diagnosis not present

## 2024-04-18 DIAGNOSIS — I6523 Occlusion and stenosis of bilateral carotid arteries: Secondary | ICD-10-CM | POA: Diagnosis not present

## 2024-04-18 DIAGNOSIS — R2689 Other abnormalities of gait and mobility: Secondary | ICD-10-CM | POA: Diagnosis not present

## 2024-04-18 DIAGNOSIS — G9389 Other specified disorders of brain: Secondary | ICD-10-CM | POA: Diagnosis not present

## 2024-04-18 DIAGNOSIS — E785 Hyperlipidemia, unspecified: Secondary | ICD-10-CM | POA: Diagnosis not present

## 2024-04-18 DIAGNOSIS — J432 Centrilobular emphysema: Secondary | ICD-10-CM | POA: Diagnosis not present

## 2024-04-18 DIAGNOSIS — I10 Essential (primary) hypertension: Secondary | ICD-10-CM | POA: Diagnosis not present

## 2024-04-18 DIAGNOSIS — R29898 Other symptoms and signs involving the musculoskeletal system: Secondary | ICD-10-CM | POA: Diagnosis not present

## 2024-04-18 DIAGNOSIS — E7800 Pure hypercholesterolemia, unspecified: Secondary | ICD-10-CM | POA: Diagnosis not present

## 2024-04-18 DIAGNOSIS — Z7901 Long term (current) use of anticoagulants: Secondary | ICD-10-CM | POA: Diagnosis not present

## 2024-04-18 DIAGNOSIS — I4891 Unspecified atrial fibrillation: Secondary | ICD-10-CM | POA: Diagnosis not present

## 2024-04-18 DIAGNOSIS — I6789 Other cerebrovascular disease: Secondary | ICD-10-CM | POA: Diagnosis not present

## 2024-04-18 DIAGNOSIS — I340 Nonrheumatic mitral (valve) insufficiency: Secondary | ICD-10-CM | POA: Diagnosis not present

## 2024-04-18 DIAGNOSIS — R410 Disorientation, unspecified: Secondary | ICD-10-CM | POA: Diagnosis not present

## 2024-04-18 DIAGNOSIS — R531 Weakness: Secondary | ICD-10-CM | POA: Diagnosis not present

## 2024-04-18 DIAGNOSIS — Z87891 Personal history of nicotine dependence: Secondary | ICD-10-CM | POA: Diagnosis not present

## 2024-04-18 DIAGNOSIS — I6782 Cerebral ischemia: Secondary | ICD-10-CM | POA: Diagnosis not present

## 2024-04-18 DIAGNOSIS — I480 Paroxysmal atrial fibrillation: Secondary | ICD-10-CM | POA: Diagnosis not present

## 2024-04-19 DIAGNOSIS — Z7901 Long term (current) use of anticoagulants: Secondary | ICD-10-CM | POA: Diagnosis not present

## 2024-04-19 DIAGNOSIS — G912 (Idiopathic) normal pressure hydrocephalus: Secondary | ICD-10-CM | POA: Diagnosis not present

## 2024-04-19 DIAGNOSIS — I361 Nonrheumatic tricuspid (valve) insufficiency: Secondary | ICD-10-CM | POA: Diagnosis not present

## 2024-04-19 DIAGNOSIS — Z79899 Other long term (current) drug therapy: Secondary | ICD-10-CM | POA: Diagnosis not present

## 2024-04-19 DIAGNOSIS — I10 Essential (primary) hypertension: Secondary | ICD-10-CM | POA: Diagnosis not present

## 2024-04-19 DIAGNOSIS — E7800 Pure hypercholesterolemia, unspecified: Secondary | ICD-10-CM | POA: Diagnosis not present

## 2024-04-19 DIAGNOSIS — I480 Paroxysmal atrial fibrillation: Secondary | ICD-10-CM | POA: Diagnosis not present

## 2024-04-19 DIAGNOSIS — I639 Cerebral infarction, unspecified: Secondary | ICD-10-CM | POA: Diagnosis not present

## 2024-04-20 DIAGNOSIS — G912 (Idiopathic) normal pressure hydrocephalus: Secondary | ICD-10-CM | POA: Diagnosis not present

## 2024-04-20 DIAGNOSIS — E7800 Pure hypercholesterolemia, unspecified: Secondary | ICD-10-CM | POA: Diagnosis not present

## 2024-04-20 DIAGNOSIS — R2990 Unspecified symptoms and signs involving the nervous system: Secondary | ICD-10-CM | POA: Diagnosis not present

## 2024-04-20 DIAGNOSIS — R918 Other nonspecific abnormal finding of lung field: Secondary | ICD-10-CM | POA: Diagnosis not present

## 2024-04-20 DIAGNOSIS — R509 Fever, unspecified: Secondary | ICD-10-CM | POA: Diagnosis not present

## 2024-04-20 DIAGNOSIS — Z79899 Other long term (current) drug therapy: Secondary | ICD-10-CM | POA: Diagnosis not present

## 2024-04-20 DIAGNOSIS — I480 Paroxysmal atrial fibrillation: Secondary | ICD-10-CM | POA: Diagnosis not present

## 2024-04-20 DIAGNOSIS — I10 Essential (primary) hypertension: Secondary | ICD-10-CM | POA: Diagnosis not present

## 2024-04-22 DIAGNOSIS — I480 Paroxysmal atrial fibrillation: Secondary | ICD-10-CM | POA: Diagnosis not present

## 2024-04-22 DIAGNOSIS — H9190 Unspecified hearing loss, unspecified ear: Secondary | ICD-10-CM | POA: Diagnosis not present

## 2024-04-22 DIAGNOSIS — I281 Aneurysm of pulmonary artery: Secondary | ICD-10-CM | POA: Diagnosis not present

## 2024-04-22 DIAGNOSIS — E785 Hyperlipidemia, unspecified: Secondary | ICD-10-CM | POA: Diagnosis not present

## 2024-04-22 DIAGNOSIS — Z7901 Long term (current) use of anticoagulants: Secondary | ICD-10-CM | POA: Diagnosis not present

## 2024-04-22 DIAGNOSIS — K523 Indeterminate colitis: Secondary | ICD-10-CM | POA: Diagnosis not present

## 2024-04-22 DIAGNOSIS — M1909 Primary osteoarthritis, other specified site: Secondary | ICD-10-CM | POA: Diagnosis not present

## 2024-04-22 DIAGNOSIS — L02421 Furuncle of right axilla: Secondary | ICD-10-CM | POA: Diagnosis not present

## 2024-04-22 DIAGNOSIS — E7800 Pure hypercholesterolemia, unspecified: Secondary | ICD-10-CM | POA: Diagnosis not present

## 2024-04-22 DIAGNOSIS — G912 (Idiopathic) normal pressure hydrocephalus: Secondary | ICD-10-CM | POA: Diagnosis not present

## 2024-04-22 DIAGNOSIS — I10 Essential (primary) hypertension: Secondary | ICD-10-CM | POA: Diagnosis not present

## 2024-04-30 DIAGNOSIS — G912 (Idiopathic) normal pressure hydrocephalus: Secondary | ICD-10-CM | POA: Diagnosis not present

## 2024-04-30 DIAGNOSIS — Z8616 Personal history of COVID-19: Secondary | ICD-10-CM | POA: Diagnosis not present

## 2024-04-30 DIAGNOSIS — Z7901 Long term (current) use of anticoagulants: Secondary | ICD-10-CM | POA: Diagnosis not present

## 2024-04-30 DIAGNOSIS — K523 Indeterminate colitis: Secondary | ICD-10-CM | POA: Diagnosis not present

## 2024-04-30 DIAGNOSIS — I10 Essential (primary) hypertension: Secondary | ICD-10-CM | POA: Diagnosis not present

## 2024-04-30 DIAGNOSIS — I480 Paroxysmal atrial fibrillation: Secondary | ICD-10-CM | POA: Diagnosis not present

## 2024-04-30 DIAGNOSIS — R2681 Unsteadiness on feet: Secondary | ICD-10-CM | POA: Diagnosis not present

## 2024-04-30 DIAGNOSIS — M1909 Primary osteoarthritis, other specified site: Secondary | ICD-10-CM | POA: Diagnosis not present

## 2024-04-30 DIAGNOSIS — I281 Aneurysm of pulmonary artery: Secondary | ICD-10-CM | POA: Diagnosis not present

## 2024-04-30 DIAGNOSIS — E785 Hyperlipidemia, unspecified: Secondary | ICD-10-CM | POA: Diagnosis not present

## 2024-04-30 DIAGNOSIS — G911 Obstructive hydrocephalus: Secondary | ICD-10-CM | POA: Diagnosis not present

## 2024-04-30 DIAGNOSIS — E7800 Pure hypercholesterolemia, unspecified: Secondary | ICD-10-CM | POA: Diagnosis not present

## 2024-04-30 DIAGNOSIS — L02421 Furuncle of right axilla: Secondary | ICD-10-CM | POA: Diagnosis not present

## 2024-04-30 DIAGNOSIS — H9190 Unspecified hearing loss, unspecified ear: Secondary | ICD-10-CM | POA: Diagnosis not present

## 2024-05-02 DIAGNOSIS — E785 Hyperlipidemia, unspecified: Secondary | ICD-10-CM | POA: Diagnosis not present

## 2024-05-02 DIAGNOSIS — I480 Paroxysmal atrial fibrillation: Secondary | ICD-10-CM | POA: Diagnosis not present

## 2024-05-02 DIAGNOSIS — G912 (Idiopathic) normal pressure hydrocephalus: Secondary | ICD-10-CM | POA: Diagnosis not present

## 2024-05-02 DIAGNOSIS — H9190 Unspecified hearing loss, unspecified ear: Secondary | ICD-10-CM | POA: Diagnosis not present

## 2024-05-02 DIAGNOSIS — L02421 Furuncle of right axilla: Secondary | ICD-10-CM | POA: Diagnosis not present

## 2024-05-02 DIAGNOSIS — Z7901 Long term (current) use of anticoagulants: Secondary | ICD-10-CM | POA: Diagnosis not present

## 2024-05-02 DIAGNOSIS — Z8616 Personal history of COVID-19: Secondary | ICD-10-CM | POA: Diagnosis not present

## 2024-05-02 DIAGNOSIS — E7800 Pure hypercholesterolemia, unspecified: Secondary | ICD-10-CM | POA: Diagnosis not present

## 2024-05-02 DIAGNOSIS — K523 Indeterminate colitis: Secondary | ICD-10-CM | POA: Diagnosis not present

## 2024-05-02 DIAGNOSIS — M1909 Primary osteoarthritis, other specified site: Secondary | ICD-10-CM | POA: Diagnosis not present

## 2024-05-02 DIAGNOSIS — I10 Essential (primary) hypertension: Secondary | ICD-10-CM | POA: Diagnosis not present

## 2024-05-02 DIAGNOSIS — I281 Aneurysm of pulmonary artery: Secondary | ICD-10-CM | POA: Diagnosis not present

## 2024-05-03 ENCOUNTER — Other Ambulatory Visit: Payer: Self-pay | Admitting: Cardiology

## 2024-05-03 DIAGNOSIS — H6062 Unspecified chronic otitis externa, left ear: Secondary | ICD-10-CM | POA: Diagnosis not present

## 2024-05-03 DIAGNOSIS — R42 Dizziness and giddiness: Secondary | ICD-10-CM | POA: Diagnosis not present

## 2024-05-03 DIAGNOSIS — I4819 Other persistent atrial fibrillation: Secondary | ICD-10-CM

## 2024-05-03 DIAGNOSIS — H6992 Unspecified Eustachian tube disorder, left ear: Secondary | ICD-10-CM | POA: Diagnosis not present

## 2024-05-06 NOTE — Telephone Encounter (Signed)
 Prescription refill request for Eliquis  received. Indication: PAF Last office visit: 03/07/24  GORMAN Sierras MD Scr: 1.03 on 04/20/24  Epic Age: 83 Weight: 70.7kg  Based on above findings Eliquis  5mg  twice daily is the appropriate dose.  Refill approved.

## 2024-05-09 DIAGNOSIS — Z860100 Personal history of colon polyps, unspecified: Secondary | ICD-10-CM | POA: Insufficient documentation

## 2024-05-09 DIAGNOSIS — Z7901 Long term (current) use of anticoagulants: Secondary | ICD-10-CM | POA: Insufficient documentation

## 2024-05-13 DIAGNOSIS — R2689 Other abnormalities of gait and mobility: Secondary | ICD-10-CM | POA: Diagnosis not present

## 2024-05-13 DIAGNOSIS — H6122 Impacted cerumen, left ear: Secondary | ICD-10-CM | POA: Diagnosis not present

## 2024-05-14 ENCOUNTER — Ambulatory Visit: Admitting: Neurology

## 2024-05-14 ENCOUNTER — Encounter: Payer: Self-pay | Admitting: Neurology

## 2024-05-14 VITALS — BP 147/78 | HR 81 | Resp 17

## 2024-05-14 DIAGNOSIS — R531 Weakness: Secondary | ICD-10-CM

## 2024-05-14 DIAGNOSIS — R2681 Unsteadiness on feet: Secondary | ICD-10-CM

## 2024-05-14 NOTE — Patient Instructions (Signed)
 Continue current medications  Continue with physical therapy as scheduled  Continue to follow up with PCP  Return as needed

## 2024-05-14 NOTE — Progress Notes (Signed)
 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 Orthostatic bp completed, moca score of 23    HISTORY OF PRESENT ILLNESS:  This is a 83 year old gentleman past medical history atrial fibrillation, hypertension, who is presenting after being admitted to the hospital in June for generalized weakness.  Patient presented to the hospital due to generalized weakness, wife tells me that she was not able to help her get out from the bed to his chair.  In the ED, his brain MRI did not show any acute stroke, but lab came back the next day showing that he did have a UTI and pneumonia.  He was treating accordingly with antibiotics.  There was some concerns based on MRI imaging of NPH and patient was referred to neurology and neurosurgery.  Wife also told me after discharge, they did have a nurse coming to the house and he was checked for COVID and they both tested positive for COVID.  He did follow-up with neurosurgery, was told that clinically he did not look like someone with NPH.  He is presenting today for evaluation of the NPH and ongoing dizziness.  Again he denies any urinary incontinence, tells me that when he was in the hospital and with his UTI, he did have incontinence but not anymore.  Wife tells me occasional confusion but no major memory concerns.  His gait is unsteady but he does not have any shuffling gait. Last fall was more than 3 years ago.    OTHER MEDICAL  CONDITIONS: Atrial fibrillation, Hypertension    REVIEW OF SYSTEMS: Full 14 system review of systems performed and negative with exception of: As noted in the HPI   ALLERGIES: No Known Allergies  HOME MEDICATIONS: Outpatient Medications Prior to Visit  Medication Sig Dispense Refill   apixaban  (ELIQUIS ) 5 MG TABS tablet TAKE 1 TABLET BY MOUTH TWICE  DAILY 200 tablet 1   atorvastatin  (LIPITOR) 20 MG tablet TAKE 1 TABLET BY MOUTH ONCE  DAILY 100 tablet 2   lisinopril (PRINIVIL,ZESTRIL) 5 MG tablet Take 5 mg by mouth daily. GETS REFILLS FROM [facility]     metoprolol  succinate (TOPROL -XL) 50 MG 24 hr tablet TAKE 1 TABLET BY MOUTH DAILY 100 tablet 3   Polyethylene Glycol 3350 (MIRALAX PO) Take by mouth every other day.     No facility-administered medications prior to visit.    PAST MEDICAL HISTORY: Past Medical History:  Diagnosis Date   Arthritis    COPD (chronic obstructive pulmonary disease) (HCC)    Essential hypertension    Paroxysmal atrial fibrillation [facility])    Diagnosed April 2017   Prostate cancer [facility])    Sleep apnea     PAST SURGICAL HISTORY: Past Surgical History:  Procedure Laterality Date   COLONOSCOPY     FRACTURE SURGERY     Right arm   HERNIA REPAIR Bilateral    POLYPECTOMY     PROSTATECTOMY      FAMILY HISTORY: Family History  Problem Relation Age of Onset   Colon cancer Neg Hx  Esophageal cancer Neg Hx    Stomach cancer Neg Hx    Rectal cancer Neg Hx    Colon polyps Neg Hx     SOCIAL HISTORY: Social History   Socioeconomic History   Marital status: Married    Spouse name: Not on file   Number of children: Not on file   Years of education: Not on file   Highest education level: Not on file  Occupational History   Not on file  Tobacco Use   Smoking status: Former    Current packs/day: 0.00    Types: Cigarettes    Quit date: 11/15/2003    Years since quitting: 20.5   Smokeless tobacco: Never  Vaping Use   Vaping status: Never Used   Substance and Sexual Activity   Alcohol use: No    Alcohol/week: 0.0 standard drinks of alcohol   Drug use: No   Sexual activity: Not on file  Other Topics Concern   Not on file  Social History Narrative   Not on file   Social Drivers of Health   Financial Resource Strain: Low Risk  (12/22/2023)   Received from [facility]   Overall Financial Resource Strain (CARDIA)    Difficulty of Paying Living Expenses: Not hard at all  Food Insecurity: No Food Insecurity (02/19/2024)   Received from [facility]   Hunger Vital Sign    Within the past 12 months, you worried that your food would run out before you got the money to buy more.: Never true    Within the past 12 months, the food you bought just didn't last and you didn't have money to get more.: Never true  Transportation Needs: No Transportation Needs (02/19/2024)   Received from [facility]   PRAPARE - Transportation    Lack of Transportation (Medical): No    Lack of Transportation (Non-Medical): No  Physical Activity: Sufficiently Active (04/18/2024)   Received from [facility]   Exercise Vital Sign    On average, how many days per week do you engage in moderate to strenuous exercise (like a brisk walk)?: 7 days    On average, how many minutes do you engage in exercise at this level?: 50 min  Stress: No Stress Concern Present (12/22/2023)   Received from [facility] - Occupational Stress Questionnaire    Feeling of Stress : Not at all  Social Connections: Moderately Integrated (04/18/2024)   Received from [facility]   Social Connection and Isolation Panel    In a typical week, how many times do you talk on the phone with family, friends, or neighbors?: Never    How often do you get together with friends or relatives?: More than three times a week    How often do you attend church or religious services?: More than 4 times per year    Do you belong to any clubs or  organizations such as church groups, unions, fraternal or athletic groups, or school groups?: No    How often do you attend meetings of the clubs or organizations you belong to?: Never    Are you married, widowed, divorced, separated, never married, or living with a partner?: Married  Intimate Partner Violence: Not At Risk (12/22/2023)   Received from [facility]   Humiliation, Afraid, Rape, and Kick questionnaire    Within the last year, have you been afraid of your partner or ex-partner?: No  Within the last year, have you been humiliated or emotionally abused in other ways by your partner or ex-partner?: No    Within the last year, have you been kicked, hit, slapped, or otherwise physically hurt by your partner or ex-partner?: No    Within the last year, have you been raped or forced to have any kind of sexual activity by your partner or ex-partner?: No     PHYSICAL EXAM  GENERAL EXAM/CONSTITUTIONAL: Vitals:  Vitals:   05/14/24 1327 05/14/24 1329 05/14/24 1331  BP: (!) 149/93 (!) 144/82 (!) 147/78  Pulse: 78 76 81  Resp: 17    SpO2: 97% 98% 97%   There is no height or weight on file to calculate BMI. Wt Readings from Last 3 Encounters:  03/07/24 155 lb 12.8 oz (70.7 kg)  08/11/23 154 lb 6.4 oz (70 kg)  01/31/23 157 lb 3.2 oz (71.3 kg)   Patient is in no distress; well developed, nourished and groomed; neck is supple  MUSCULOSKELETAL: Gait, strength, tone, movements noted in Neurologic exam below  NEUROLOGIC: MENTAL STATUS:     05/14/2024    1:33 PM  MMSE - Mini Mental State Exam  Orientation to time 5  Orientation to Place 5  Registration 2  Attention/ Calculation 2  Recall 0  Language- name 2 objects 2  Language- repeat 1  Language- follow 3 step command 3  Language- read & follow direction 1  Write a sentence 1  Copy design 1  Total score 23   awake, alert, oriented to person, place and time recent and remote memory intact normal attention and  concentration language fluent, comprehension intact, naming intact fund of knowledge appropriate  CRANIAL NERVE:  2nd, 3rd, 4th, 6th - Visual fields full to confrontation, extraocular muscles intact, no nystagmus 5th - facial sensation symmetric 7th - facial strength symmetric 8th - hearing intact 9th - palate elevates symmetrically, uvula midline 11th - shoulder shrug symmetric 12th - tongue protrusion midline  MOTOR:  normal bulk and tone, full strength in the BUE, BLE  SENSORY:  normal and symmetric to light touch  COORDINATION:  finger-nose-finger, fine finger movements normal  GAIT/STATION:  Sitting in his wheelchair, upon standing complained of dizziness (head rush) but able to walk without assistance, no shuffling of magnetic gait observed.    DIAGNOSTIC DATA (LABS, IMAGING, TESTING) - I reviewed patient records, labs, notes, testing and imaging myself where available.  Lab Results  Component Value Date   WBC 7.7 11/14/2007   HGB 16.7 07/25/2013   HCT 49.0 07/25/2013   MCV 92.8 11/14/2007   PLT 258 11/14/2007      Component Value Date/Time   NA 141 07/25/2013 0820   K 4.3 07/25/2013 0820   CL 105 07/25/2013 0820   CO2 30 11/14/2007 1238   GLUCOSE 104 (H) 07/25/2013 0820   BUN 17 07/25/2013 0820   CREATININE 0.90 07/25/2013 0820   CALCIUM  9.3 11/14/2007 1238   PROT 6.9 11/14/2007 1238   ALBUMIN 3.7 11/14/2007 1238   AST 19 11/14/2007 1238   ALT 18 11/14/2007 1238   ALKPHOS 121 (H) 11/14/2007 1238   BILITOT 0.9 11/14/2007 1238   GFRNONAA 103 11/14/2007 1238   GFRAA 124 11/14/2007 1238   Lab Results  Component Value Date   CHOL 195 08/11/2021   HDL 45 08/11/2021   LDLCALC 135 (H) 08/11/2021   TRIG 83 08/11/2021   CHOLHDL 4.3 08/11/2021   Lab Results  Component Value Date   HGBA1C 6.2 (  H) 08/11/2021   No results found for: [id number] Lab Results  Component Value Date   TSH 1.68 11/14/2007    MRI Brain  04/18/2024 1.    Ventriculomegaly.  Ancillary findings favor normal pressure hydrocephalus over central volume loss as the etiology.  2.    Mild/moderate chronic microvascular ischemic changes in the cerebral white matter. No infarction.  3.    Evidence of hippocampal atrophy.  4.    No visible change from the last brain MRI of 07/02/2021.     ASSESSMENT AND PLAN  83 y.o. year old male with atrial fibrillation, hypertension who is presenting after being admitted to the hospital for generalized weakness found to have a UTI, pneumonia and later COVID.  During admission patient did have an MRI which showed ventriculomegaly.  Clinically he does not have signs of NPH with the ongoing confusion and memory loss, urinary incontinence and magnetic gait.  I have informed patient that we do not need to pursue lumbar puncture.  He voiced understanding.  Plan for now is to complete physical therapy, they will start on July 9 and to continue following up with your PCP and return as needed.   1. Weakness   2. Unsteady gait      Patient Instructions  Continue current medications  Continue with physical therapy as scheduled  Continue to follow up with PCP  Return as needed  No orders of the defined types were placed in this encounter.   No orders of the defined types were placed in this encounter.   Return if symptoms worsen or fail to improve.  I personally spent a total of 50 minutes in the care of the patient today including preparing to see the patient, getting/reviewing separately obtained history, performing a medically appropriate exam/evaluation, counseling and educating, placing orders, documenting clinical information in the EHR, independently interpreting results, and communicating results.   Pastor Falling, MD 05/14/2024, 2:30 PM  [facility] [address], [address] [place], [place] [address] [phone]

## 2024-05-22 DIAGNOSIS — R2689 Other abnormalities of gait and mobility: Secondary | ICD-10-CM | POA: Diagnosis not present

## 2024-05-22 DIAGNOSIS — H6992 Unspecified Eustachian tube disorder, left ear: Secondary | ICD-10-CM | POA: Diagnosis not present

## 2024-05-22 DIAGNOSIS — R42 Dizziness and giddiness: Secondary | ICD-10-CM | POA: Diagnosis not present

## 2024-05-24 DIAGNOSIS — H40033 Anatomical narrow angle, bilateral: Secondary | ICD-10-CM | POA: Diagnosis not present

## 2024-05-24 DIAGNOSIS — H2513 Age-related nuclear cataract, bilateral: Secondary | ICD-10-CM | POA: Diagnosis not present

## 2024-05-28 DIAGNOSIS — H6992 Unspecified Eustachian tube disorder, left ear: Secondary | ICD-10-CM | POA: Diagnosis not present

## 2024-05-28 DIAGNOSIS — R2689 Other abnormalities of gait and mobility: Secondary | ICD-10-CM | POA: Diagnosis not present

## 2024-05-28 DIAGNOSIS — R42 Dizziness and giddiness: Secondary | ICD-10-CM | POA: Diagnosis not present

## 2024-05-30 DIAGNOSIS — R42 Dizziness and giddiness: Secondary | ICD-10-CM | POA: Diagnosis not present

## 2024-06-05 DIAGNOSIS — H6992 Unspecified Eustachian tube disorder, left ear: Secondary | ICD-10-CM | POA: Diagnosis not present

## 2024-06-05 DIAGNOSIS — R42 Dizziness and giddiness: Secondary | ICD-10-CM | POA: Diagnosis not present

## 2024-06-05 DIAGNOSIS — R2689 Other abnormalities of gait and mobility: Secondary | ICD-10-CM | POA: Diagnosis not present

## 2024-10-14 ENCOUNTER — Telehealth: Payer: Self-pay | Admitting: Cardiology

## 2024-10-14 DIAGNOSIS — I4819 Other persistent atrial fibrillation: Secondary | ICD-10-CM

## 2024-10-14 NOTE — Telephone Encounter (Signed)
*  STAT* If patient is at the pharmacy, call can be transferred to refill team.   1. Which medications need to be refilled? (please list name of each medication and dose if known)   apixaban (ELIQUIS) 5 MG TABS tablet    2. Which pharmacy/location (including street and city if local pharmacy) is medication to be sent to? [facility] - [place], [place] - [address] Phone: [phone]  Fax: [phone]     3. Do they need a 30 day or 90 day supply? 90

## 2024-10-18 MED ORDER — APIXABAN 5 MG PO TABS: 5.0000 mg | ORAL_TABLET | Freq: Two times a day (BID) | ORAL | 1 refills | Status: AC

## 2024-10-18 NOTE — Telephone Encounter (Signed)
 Eliquis  5mg  refill request received. Patient is 83 years old, weight-70.7kg, Crea- 1.03 on 04/20/24 via [facility] from [facility], Diagnosis-Afib, and last seen by Dr. Debera on 03/07/24. Dose is appropriate based on dosing criteria. Will send in refill to requested pharmacy.

## 2024-12-03 ENCOUNTER — Ambulatory Visit: Attending: Cardiology | Admitting: Cardiology

## 2024-12-03 ENCOUNTER — Encounter: Payer: Self-pay | Admitting: Cardiology

## 2024-12-03 VITALS — BP 98/60 | HR 80 | Ht 72.0 in | Wt 155.2 lb

## 2024-12-03 DIAGNOSIS — I4819 Other persistent atrial fibrillation: Secondary | ICD-10-CM | POA: Diagnosis not present

## 2024-12-03 DIAGNOSIS — I6523 Occlusion and stenosis of bilateral carotid arteries: Secondary | ICD-10-CM | POA: Diagnosis not present

## 2024-12-03 DIAGNOSIS — I10 Essential (primary) hypertension: Secondary | ICD-10-CM | POA: Diagnosis not present

## 2024-12-03 MED ORDER — LISINOPRIL 5 MG PO TABS: 2.5000 mg | ORAL_TABLET | Freq: Every day | ORAL | 2 refills | Status: AC

## 2024-12-03 NOTE — Progress Notes (Signed)
 "    Cardiology Office Note  Date: 12/03/2024   ID: Robert Gonzalez, DOB 1941/10/04, MRN [id number]  History of Present Illness: Robert Gonzalez is an 84 y.o. male last seen in April 2025.  He is here for a follow-up visit.  He does not report any palpitations or chest discomfort.  States that he has had a general feeling of lightheadedness/dizziness going back to the summer 2025.  He did undergo workup at [facility] including neuroimaging and was referred for what was suspected to potentially be NPH, although felt not to be the case based on neurology and neurosurgical consultation.  We went over his medications.  Orthostatic measurements were made today.  110/80 with heart rate 76 supine, 100/64 with heart rate 80 seated, 102/60 with heart rate 88 standing, and 98/60 with heart rate 80 standing after 3 minutes.  I did review his echocardiogram result from June 2025 at [facility] as noted below.  Physical Exam: VS:  BP 112/74 (BP Location: Left Arm)   Pulse (!) 57   Ht 6' (1.829 m)   Wt 155 lb 3.2 oz (70.4 kg)   SpO2 100%   BMI 21.05 kg/m , BMI Body mass index is 21.05 kg/m.  Wt Readings from Last 3 Encounters:  12/03/24 155 lb 3.2 oz (70.4 kg)  03/07/24 155 lb 12.8 oz (70.7 kg)  08/11/23 154 lb 6.4 oz (70 kg)    General: Patient appears comfortable at rest. HEENT: Conjunctiva and lids normal. Neck: Supple, no elevated JVP or carotid bruits. Lungs: Clear to auscultation, nonlabored breathing at rest. Cardiac: Irregularly irregular, no significant murmur or gallop. Extremities: No pitting edema.  ECG:  An ECG dated 03/07/2024 was personally reviewed today and demonstrated:  Atrial fibrillation with nonspecific ST changes.  Labwork:  June 2025: Hemoglobin 15.1, platelets 137, potassium 4.0, BUN 14, creatinine 1.03, GFR 72, AST 25, ALT 22, cholesterol 115, triglycerides 26, HDL 53, LDL 58, TSH 0.875  Other Studies Reviewed Today:  Echocardiogram 04/19/2024 [facility]): Summary   1. The left ventricle is normal in size with normal wall thickness.    2. The left ventricular systolic function is normal, LVEF is visually  estimated at > 55%.    3. The mitral valve leaflets are mildly thickened with normal leaflet  mobility.   4. Mitral annular calcification is present (mild).    5. There is mild mitral valve regurgitation.    6. There is mild to moderate aortic regurgitation.    7. The left atrium is mildly dilated in size.    8. The right ventricle is normal in size, with normal systolic function.    9. The right atrium is mildly to moderately dilated in size.    10. IVC size and inspiratory change suggest mildly elevated right atrial  pressure. (5-10 mmHg).   Head and neck CTA 04/18/2024 [facility]): 1.    No intracranial large vessel occlusion.  2.    Atherosclerotic disease causes 50% stenosis of the proximal left ICA and less than 50% stenosis of the proximal right ICA.   Assessment and Plan:  1.  Persistent atrial fibrillation with CHA2DS2-VASc score 5.  No reported palpitations.  Heart rate control adequate today.  Currently on Toprol -XL 50 mg daily.  Continue Eliquis  5 mg twice daily for stroke prophylaxis.  He does not report any spontaneous bleeding problems.   2.  Primary hypertension.  Blood pressure low normal today on lisinopril  5 mg daily.  Did have  mild drop with standing although nondiagnostic.  Plan to reduce lisinopril  to 2.5 mg daily and track blood pressure at home, may be able to come off this medication and hopefully this will help with his general feeling of lightheadedness.   3.  Asymptomatic carotid artery disease.  Most recently imaged by CTA in June 2025 at [facility] at which point he had evidence of 50% stenosis involving the proximal LICA and less than 50% stenosis involving the proximal RICA.  We will plan on carotid Dopplers in June of this year.  Continue Lipitor 20 mg daily.  LDL was 58 in June  2025.  Disposition:  Follow up 6 months.  Signed, Jayson JUDITHANN Sierras, M.D., F.A.C.C. [facility]

## 2024-12-03 NOTE — Patient Instructions (Addendum)
 Medication Instructions:  Your physician has recommended you make the following change in your medication:  Decrease lisinopril  to 2.5 mg daily Continue all other medications as prescribed  Labwork: none  Testing/Procedures: Your physician has requested that you have a carotid duplex. This test is an ultrasound of the carotid arteries in your neck. It looks at blood flow through these arteries that supply the brain with blood. Allow one hour for this exam. There are no restrictions or special instructions.  Follow-Up: Your physician recommends that you schedule a follow-up appointment in: 6 months  Any Other Special Instructions Will Be Listed Below (If Applicable). Your physician has requested that you regularly monitor and record your blood pressure readings at home. Please use the same machine at the same time of day to check your readings and record them to bring to your follow-up visit.  If you need a refill on your cardiac medications before your next appointment, please call your pharmacy.

## 2025-04-21 ENCOUNTER — Ambulatory Visit
